# Patient Record
Sex: Female | Born: 1965 | Race: Black or African American | Hispanic: No | Smoking: Never smoker
Health system: Southern US, Community
[De-identification: ages and names within clinical notes are randomized; demographics above are authoritative.]

## PROBLEM LIST (undated history)

## (undated) ENCOUNTER — Ambulatory Visit (HOSPITAL_COMMUNITY): Admission: EM | Disposition: A | Discharge status: Home / Self Care | Payer: MEDICAID

## (undated) ENCOUNTER — Emergency Department (HOSPITAL_COMMUNITY): Payer: No Typology Code available for payment source

## (undated) DIAGNOSIS — N921 Excessive and frequent menstruation with irregular cycle: Secondary | ICD-10-CM

## (undated) DIAGNOSIS — D509 Iron deficiency anemia, unspecified: Secondary | ICD-10-CM

## (undated) DIAGNOSIS — R569 Unspecified convulsions: Secondary | ICD-10-CM

## (undated) DIAGNOSIS — D649 Anemia, unspecified: Secondary | ICD-10-CM

## (undated) DIAGNOSIS — R011 Cardiac murmur, unspecified: Secondary | ICD-10-CM

## (undated) DIAGNOSIS — D259 Leiomyoma of uterus, unspecified: Secondary | ICD-10-CM

## (undated) DIAGNOSIS — R0602 Shortness of breath: Secondary | ICD-10-CM

## (undated) DIAGNOSIS — R102 Pelvic and perineal pain: Secondary | ICD-10-CM

## (undated) HISTORY — DX: Cardiac murmur, unspecified: R01.1

## (undated) HISTORY — DX: Anemia, unspecified: D64.9

## (undated) HISTORY — PX: BREAST SURGERY: SHX581

## (undated) HISTORY — PX: MYOMECTOMY ABDOMINAL APPROACH: SUR870

## (undated) HISTORY — DX: Unspecified convulsions: R56.9

## (undated) NOTE — *Deleted (*Deleted)
HEMATOLOGY/ONCOLOGY CONSULTATION NOTE  Date of Service: 05/20/2020  Patient Care Team: Dois Davenport, MD as PCP - General (Family Medicine)  CHIEF COMPLAINTS/PURPOSE OF CONSULTATION:  Severe anemia  HISTORY OF PRESENTING ILLNESS:  Joanna Gross is a wonderful 32 y.o. female who has been referred to [place] by Dr. Hal Hope for evaluation and management of severe anemia. Pt is accompanied today by her mother. The pt reports that she is doing well overall.   The pt reports that her menstrual cycles last 10-12 days, with 2-3 heavy days. Pt has a history of Fibroids and two myomectomies. She currently has a fibroid. She has never used oral birth control. Pt has used Progesterone once to halt her period. Pt is currently following with an OBGYN.   Pt has a long history of anemia and required two units of blood in 2019. She has never been given IV Iron. She was given PO Iron, but was unable to continue taking it as it was causing constipation. Pt has been taking prenatal iron since 2019 and was switched to Fusion Plus iron on Monday. Pt has not had a Colonoscopy since turning 52 and reports regular bowel movements. She has significant ice cravings.  She currently has a sinus infection and began taking Doxycycline on Monday. Pt has lost 15 lbs in the last month with effort. She has recently developed new allergies. She has a history of Vitamin D deficiency. 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 Pt has no family history of blood disorders. Pt has had ankle swelling and restless legs at night recently.   Most recent lab results (03/03/2020) of CBC is as follows: all values are WNL except for Hgb at 6.6, MCV at 69. 03/03/2020 Ferritin at 4, Iron Sat 3.  05/18/2019 Vitamin D 25 (OH) at 16.7 05/18/2019 Ferritin at 13.2  On review of systems, pt reports palpitations, headaches, SOB, dizziness, lightheadedness, left forearm numbness, muscle cramping, numbness/tingling in legs and  denies bloody/black stools, bowel habit changes, fevers, abdominal pain, unexpected weight loss and any other symptoms.   On PMHx the pt reports Anemia, Heart murmur, Vitamin D deficiency, Fibroids, Myomectomy x2.  INTERVAL HISTORY: Joanna Gross is a wonderful 42 y.o. female who is here for evaluation and management of severe anemia. The patient's last visit with [place] was on 03/25/2020. The pt reports that she is doing well overall.  The pt reports ***  Of note since the patient's last visit, pt has had *** completed on *** with results revealing ***.  Lab results today (05/20/20) of CBC w/diff and CMP is as follows: all values are WNL except for ***. 05/20/2020 Ferritin at *** 05/20/2020 Vitamin B1 at *** 05/20/2020 Folate RBC at *** 05/20/2020 Iron Panel is as follows: ***  On review of systems, pt reports *** and denies ***and any other symptoms.   A&P: -Discussed pt labwork today, 05/20/20; *** -***  MEDICAL HISTORY:  Past Medical History:  Diagnosis Date  . Anemia   . Heart murmur   . Seizures (HCC) 1975   1 episode.  No meds since 1988    SURGICAL HISTORY: Past Surgical History:  Procedure Laterality Date  . BREAST SURGERY  nov. 2010   fibroid removed from right breast (3x)  . BREAST SURGERY  1989   fibroid removed from left breast  . BUNIONECTOMY  1999  . CYSTECTOMY  1974   left side of neck   . UTERINE FIBROID SURGERY  october 2003    SOCIAL HISTORY: Social History   Socioeconomic History  . Marital status: Divorced    Spouse name: n/a  . Number of children: 0  . Years of education: Not on file  . Highest education level: Not on file  Occupational History  . Occupation: [employer]    Comment: self-employed  Tobacco Use  . Smoking status: Never Smoker  . Smokeless tobacco: Never Used  Vaping Use  . Vaping Use: Never used  Substance and Sexual Activity  . Alcohol use: Yes    Alcohol/week: 0.0 - 1.0 standard drinks    Comment: RARE  . Drug use:  No  . Sexual activity: Not Currently    Partners: Male    Birth control/protection: Condom    Comment: 1st intercourse- 51, partners- 7, divorced   Other Topics Concern  . Not on file  Social History Narrative  . Not on file   Social Determinants of Health   Financial Resource Strain:   . Difficulty of Paying Living Expenses: Not on file  Food Insecurity:   . Worried About Programme researcher, broadcasting/film/video in the Last Year: Not on file  . Ran Out of Food in the Last Year: Not on file  Transportation Needs:   . Lack of Transportation (Medical): Not on file  . Lack of Transportation (Non-Medical): Not on file  Physical Activity:   . Days of Exercise per Week: Not on file  . Minutes of Exercise per Session: Not on file  Stress:   . Feeling of Stress : Not on file  Social Connections:   . Frequency of Communication with Friends and Family: Not on file  . Frequency of Social Gatherings with Friends and Family: Not on file  . Attends Religious Services: Not on file  . Active Member of Clubs or Organizations: Not on file  . Attends Banker Meetings: Not on file  . Marital Status: Not on file  Intimate Partner Violence:   . Fear of Current or Ex-Partner: Not on file  . Emotionally Abused: Not on file  . Physically Abused: Not on file  . Sexually Abused: Not on file    FAMILY HISTORY: Family History  Problem Relation Age of Onset  . Arthritis Mother   . Hypertension Father   . Diabetes Father     ALLERGIES:  is allergic to amoxicillin, furosemide, penicillins, bee venom, erythromycin, and eucalyptus flavor [flavoring agent].  MEDICATIONS:  Current Outpatient Medications  Medication Sig Dispense Refill  . acetaminophen-codeine (TYLENOL #3) 300-30 MG tablet Take 1 tablet by mouth every 6 (six) hours as needed for moderate pain.    Marland Kitchen doxycycline (VIBRAMYCIN) 100 MG capsule Take 100 mg by mouth daily.    . Fe Fum-Fe Poly-Vit C-Lactobac (FUSION PO) Take by mouth.    .  Fluticasone Propionate (FLONASE ALLERGY RELIEF NA)     . HYDROcodone-acetaminophen (NORCO/VICODIN) 5-325 MG tablet Take 1-2 tablets by mouth every 6 (six) hours as needed for severe pain. 12 tablet 0  . ibuprofen (ADVIL) 800 MG tablet Take 800 mg by mouth every 8 (eight) hours as needed.    . Iron-FA-B Cmp-C-Biot-Probiotic (FUSION PLUS PO)     . norethindrone (MICRONOR) 0.35 MG tablet Take 1 tablet (0.35 mg total) by mouth daily. 84 tablet 4   No current facility-administered medications for this visit.    REVIEW OF SYSTEMS:   A 10+ POINT REVIEW OF SYSTEMS WAS OBTAINED including neurology, dermatology, psychiatry, cardiac, respiratory, lymph, extremities,  GI, GU, Musculoskeletal, constitutional, breasts, reproductive, HEENT.  All pertinent positives are noted in the HPI.  All others are negative.   PHYSICAL EXAMINATION: ECOG PERFORMANCE STATUS: 1 - Symptomatic but completely ambulatory  . There were no vitals filed for this visit. There were no vitals filed for this visit. .There is no height or weight on file to calculate BMI.  *** GENERAL:alert, in no acute distress and comfortable SKIN: no acute rashes, no significant lesions EYES: conjunctiva are pink and non-injected, sclera anicteric OROPHARYNX: MMM, no exudates, no oropharyngeal erythema or ulceration NECK: supple, no JVD LYMPH:  no palpable lymphadenopathy in the cervical, axillary or inguinal regions LUNGS: clear to auscultation b/l with normal respiratory effort HEART: regular rate & rhythm ABDOMEN:  normoactive bowel sounds , non tender, not distended. No palpable hepatosplenomegaly.  Extremity: no pedal edema PSYCH: alert & oriented x 3 with fluent speech NEURO: no focal motor/sensory deficits  LABORATORY DATA:  I have reviewed the data as listed  . CBC Latest Ref Rng & Units 03/25/2020 03/25/2020 03/27/2018  WBC 4.0 - 10.5 K/uL 5.9 - 5.5  Hemoglobin 12.0 - 15.0 g/dL 7.1(L) - 8.9(L)  Hematocrit 34.0 - 46.6 % 26.3(L)  25.8(L) 31.0(L)  Platelets 150 - 400 K/uL 290 - 265   . CBC    Component Value Date/Time   WBC 5.9 03/25/2020 1409   RBC 3.90 03/25/2020 1409   HGB 7.1 (L) 03/25/2020 1409   HCT 26.3 (L) 03/25/2020 1409   HCT 25.8 (L) 03/25/2020 1409   PLT 290 03/25/2020 1409   MCV 67.4 (L) 03/25/2020 1409   MCV 79.1 (A) 08/06/2013 1534   MCH 18.2 (L) 03/25/2020 1409   MCHC 27.0 (L) 03/25/2020 1409   RDW 20.1 (H) 03/25/2020 1409   LYMPHSABS 1.0 03/25/2020 1409   MONOABS 0.5 03/25/2020 1409   EOSABS 0.0 03/25/2020 1409   BASOSABS 0.0 03/25/2020 1409    . CMP Latest Ref Rng & Units 03/25/2020 03/26/2018 03/26/2018  Glucose 70 - 99 mg/dL 161(W) 97 98  BUN 6 - 20 mg/dL 6 9 8   Creatinine 0.44 - 1.00 mg/dL 9.60 4.54 0.98  Sodium 135 - 145 mmol/L 137 137 135  Potassium 3.5 - 5.1 mmol/L 3.9 4.2 3.9  Chloride 98 - 111 mmol/L 106 103 101  CO2 22 - 32 mmol/L 26 26 24   Calcium 8.9 - 10.3 mg/dL 9.8 9.6 9.5  Total Protein 6.5 - 8.1 g/dL 7.9 8.1 8.0  Total Bilirubin 0.3 - 1.2 mg/dL 0.4 0.8 0.3  Alkaline Phos 38 - 126 U/L 78 56 62  AST 15 - 41 U/L 18 37 23  ALT 0 - 44 U/L 17 18 16    . Lab Results  Component Value Date   IRON 18 (L) 03/25/2020   TIBC 460 (H) 03/25/2020   IRONPCTSAT 4 (L) 03/25/2020   (Iron and TIBC)  Lab Results  Component Value Date   FERRITIN 6 (L) 03/25/2020     RADIOGRAPHIC STUDIES: I have personally reviewed the radiological images as listed and agreed with the findings in the report. No results found.  ASSESSMENT & PLAN:   53 yo with   1) Severe iron deficiency with microcytic severe anemia PLAN: ***  -Advised pt that her iron deficiency and anemia is likely from heavy menstrual losses.  -Not unreasonable for pt to continue taking Fusion Plus (130 mg Iron) after IV Iron to maintain Ferritin levels.  -Recommend pt begin an OTC B-complex daily.  -Would hold blood transfusion if Hgb >  7.    FOLLOW UP: ***   The total time spent in the appt was *** minutes and  more than 50% was on counseling and direct patient cares.  All of the patient's questions were answered with apparent satisfaction. The patient knows to call the clinic with any problems, questions or concerns.    Wyvonnia Lora MD MS AAHIVMS [facility] Hematology/Oncology Physician [facility]  (Office):       [phone] (Work cell):  [phone] (Fax):           [phone]  05/20/2020 7:27 AM  I, Carollee Herter, am acting as a scribe for Dr. Wyvonnia Lora.   {Add Production assistant, radio Statement}

---
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
## 1987-07-05 HISTORY — PX: BREAST SURGERY: SHX581

## 1997-07-04 HISTORY — PX: BUNIONECTOMY: SHX129

## 2002-04-03 HISTORY — PX: UTERINE FIBROID SURGERY: SHX826

## 2004-10-27 ENCOUNTER — Emergency Department (HOSPITAL_COMMUNITY): Admission: EM | Admit: 2004-10-27 | Discharge: 2004-10-27 | Payer: Self-pay | Admitting: Emergency Medicine

## 2007-03-18 ENCOUNTER — Emergency Department (HOSPITAL_COMMUNITY): Admission: EM | Admit: 2007-03-18 | Discharge: 2007-03-18 | Payer: Self-pay | Admitting: Emergency Medicine

## 2007-05-12 ENCOUNTER — Emergency Department (HOSPITAL_COMMUNITY): Admission: EM | Admit: 2007-05-12 | Discharge: 2007-05-13 | Payer: Self-pay | Admitting: Emergency Medicine

## 2007-05-16 ENCOUNTER — Ambulatory Visit: Payer: Self-pay | Admitting: Family Medicine

## 2007-05-16 DIAGNOSIS — R079 Chest pain, unspecified: Secondary | ICD-10-CM | POA: Insufficient documentation

## 2008-04-07 ENCOUNTER — Encounter: Admission: RE | Admit: 2008-04-07 | Discharge: 2008-04-07 | Payer: Self-pay | Admitting: Family Medicine

## 2009-05-04 HISTORY — PX: BREAST SURGERY: SHX581

## 2010-07-25 ENCOUNTER — Encounter: Payer: Self-pay | Admitting: Surgery

## 2011-04-12 LAB — I-STAT 8, (EC8 V) (CONVERTED LAB)
BUN: 8
Bicarbonate: 24
Chloride: 105
Glucose, Bld: 96
Potassium: 3.8
pH, Ven: 7.358 — ABNORMAL HIGH

## 2011-04-12 LAB — PROTIME-INR: INR: 0.9

## 2011-04-12 LAB — POCT CARDIAC MARKERS
Myoglobin, poc: 54.6
Operator id: 133351

## 2011-05-24 ENCOUNTER — Ambulatory Visit (INDEPENDENT_AMBULATORY_CARE_PROVIDER_SITE_OTHER): Payer: Self-pay | Admitting: General Surgery

## 2011-05-24 ENCOUNTER — Encounter (INDEPENDENT_AMBULATORY_CARE_PROVIDER_SITE_OTHER): Payer: Self-pay | Admitting: General Surgery

## 2011-05-24 VITALS — BP 128/82 | HR 64 | Temp 97.2°F | Resp 16 | Ht 65.5 in | Wt 220.2 lb

## 2011-05-24 DIAGNOSIS — N6320 Unspecified lump in the left breast, unspecified quadrant: Secondary | ICD-10-CM

## 2011-05-24 DIAGNOSIS — N63 Unspecified lump in unspecified breast: Secondary | ICD-10-CM

## 2011-05-24 NOTE — Patient Instructions (Signed)
Plan for mammogram and u/s

## 2011-05-30 ENCOUNTER — Encounter (INDEPENDENT_AMBULATORY_CARE_PROVIDER_SITE_OTHER): Payer: Self-pay | Admitting: General Surgery

## 2011-05-30 NOTE — Progress Notes (Signed)
Subjective:     Patient ID: Joanna Gross, female   DOB: 1965/09/01, 45 y.o.   MRN: [id number]  HPI We are asked to see the patient in consultation by Dr. Myrtis Ser to evaluate her for a left breast mass. The patient is a 45 year old female who has been noticing some soreness on the lateral aspect of the left breast. The pain tends to come and go but it does not seem to rotate around her menstrual cycle. She has felt a lump recently. She denies any discharge from her breast. She does have a history of for fibroadenomas removed 3 from the right and one from the left.  Review of Systems  Constitutional: Negative.   HENT: Negative.   Eyes: Negative.   Respiratory: Negative.   Cardiovascular: Negative.   Gastrointestinal: Negative.   Genitourinary: Negative.   Musculoskeletal: Negative.   Skin: Negative.   Neurological: Negative.   Hematological: Negative.   Psychiatric/Behavioral: Negative.        Objective:   Physical Exam  Constitutional: She is oriented to person, place, and time. She appears well-developed and well-nourished.  HENT:  Head: Normocephalic and atraumatic.  Eyes: Conjunctivae and EOM are normal. Pupils are equal, round, and reactive to light.  Neck: Normal range of motion. Neck supple.  Cardiovascular: Normal rate, regular rhythm and normal heart sounds.   Pulmonary/Chest: Effort normal and breath sounds normal.       The patient does have a palpable mobile well-circumscribed mass in the lateral aspect of the left breast. No other palpable masses. No axillary supraclavicular or cervical lymphadenopathy.  Abdominal: Soft. Bowel sounds are normal. She exhibits no mass. There is no tenderness.  Musculoskeletal: Normal range of motion.  Lymphadenopathy:    She has no cervical adenopathy.  Neurological: She is alert and oriented to person, place, and time.  Skin: Skin is warm and dry.  Psychiatric: She has a normal mood and affect. Her behavior is normal.         Assessment:     The patient does have a palpable mass in the lateral aspect of left breast. She has a history of fibroadenomas. It does fill well circumscribed similar to a fibroadenoma.    Plan:     At this point she will need to be evaluated with mammogram and ultrasound and possible biopsy of a mass was found. We will order the studies and plan to see her back in about 2 weeks ago the results.

## 2011-06-02 ENCOUNTER — Ambulatory Visit
Admission: RE | Admit: 2011-06-02 | Discharge: 2011-06-02 | Disposition: A | Discharge status: Home / Self Care | Payer: No Typology Code available for payment source | Source: Ambulatory Visit | Attending: General Surgery | Admitting: General Surgery

## 2011-06-02 DIAGNOSIS — N6320 Unspecified lump in the left breast, unspecified quadrant: Secondary | ICD-10-CM

## 2011-06-09 ENCOUNTER — Encounter (INDEPENDENT_AMBULATORY_CARE_PROVIDER_SITE_OTHER): Payer: Self-pay | Admitting: General Surgery

## 2011-06-13 ENCOUNTER — Encounter (INDEPENDENT_AMBULATORY_CARE_PROVIDER_SITE_OTHER): Payer: Self-pay | Admitting: General Surgery

## 2011-06-23 ENCOUNTER — Encounter (INDEPENDENT_AMBULATORY_CARE_PROVIDER_SITE_OTHER): Payer: Self-pay | Admitting: General Surgery

## 2011-08-07 ENCOUNTER — Ambulatory Visit: Payer: Self-pay | Admitting: Family Medicine

## 2011-08-07 VITALS — BP 114/71 | HR 60 | Temp 97.9°F | Resp 16 | Ht 66.0 in | Wt 220.0 lb

## 2011-08-07 DIAGNOSIS — R51 Headache: Secondary | ICD-10-CM

## 2011-08-07 DIAGNOSIS — H538 Other visual disturbances: Secondary | ICD-10-CM

## 2011-08-07 DIAGNOSIS — M25512 Pain in left shoulder: Secondary | ICD-10-CM

## 2011-08-07 DIAGNOSIS — R519 Headache, unspecified: Secondary | ICD-10-CM

## 2011-08-07 DIAGNOSIS — M25519 Pain in unspecified shoulder: Secondary | ICD-10-CM

## 2011-08-07 DIAGNOSIS — N912 Amenorrhea, unspecified: Secondary | ICD-10-CM

## 2011-08-07 LAB — POCT URINE PREGNANCY: Preg Test, Ur: NEGATIVE

## 2011-08-07 LAB — GLUCOSE, POCT (MANUAL RESULT ENTRY): POC Glucose: 102

## 2011-08-07 MED ORDER — HYDROCODONE-ACETAMINOPHEN 5-500 MG PO TABS: 1.0000 | ORAL_TABLET | Freq: Three times a day (TID) | ORAL | Status: AC | PRN

## 2011-08-07 MED ORDER — PREDNISONE 20 MG PO TABS: 20.0000 mg | ORAL_TABLET | Freq: Every day | ORAL | Status: AC

## 2011-08-07 NOTE — Progress Notes (Signed)
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  Has tried herbal meds, chiropractor without sustained relief.  Right knee is better. Now cannot lift left arm without pain.  Also c/o worsening headaches lately with posterior trapezius pain on the right.  Since 1st of year, headaches are daily.  Did preg test at home which was normal(not pregnant).  Last normal period was Dec 14th.  Occas blurry visioin.  No nausea.  Doesn't awaken.  No fever or neck stiffness.  O:  NAD Skin warm and dry Neuro:  Alert, appropriate.  Moves 4 extrem equally.  CN intact HEENT normal  Left shoulder:  Tender at biceps insertion with slow painful but FROM   Assess:  Amenorrhea, chronic right knee pain, neck and shoulder pain on L.  P:  Prednisone for the shoulder pain and knee pain.  Pt to call re: amenorrhea if No period in 2 weeks.  Vicodin #10 1 q6h prn ha

## 2011-08-07 NOTE — Patient Instructions (Signed)
Primary Amenorrhea  Primary amenorrhea is the absence of any menstrual flow in a woman by the age of sixteen. An average age for the start of menstruation is age twelve. Primary amenorrhea is not considered to have occurred until a girl is over age 44 and has never menstruated. This may occur with or without other signs of puberty. CAUSES  Some common causes of not menstruating include:  Malnutrition.   Low blood sugar (hypoglycemia).   Polycystic ovarian disease (cysts in the ovaries, not ovulating).   Absence of the vagina, uterus, or ovaries since birth (congenital).   Extreme obesity.   Cystic fibrosis.   Drastic weight loss from any cause.   Over-exercising (running, biking) causing loss of body fat.   Pituitary gland tumor in the brain.   Long-term (chronic) illnesses.   Cushing's disease.   Thyroid disease (hypothyroidism, hyperthyroidism).   Part of the brain (hypothalamus) not functioning.   Premature ovarian failure.  DIAGNOSIS  This diagnosis is made by doing a medical history and physical exam. Often, numerous blood tests of different hormones in the body may be done, along with some urine testing. Specialized x-rays may need to be done, as well as measuring the Body Mass Index (BMI). If your BMI is less than 20, the hypothalamus may be the problem. If it is greater than 30, the cause may be diabetes mellitus. Pregnancy must be ruled out. TREATMENT  Treatment depends on the cause of the amenorrhea. If an eating disorder is present, this can be treated with an adequate diet and therapy. Chronic illnesses may improve with treatment of the illness. Overall, the outlook is good, and amenorrhea may be corrected with medicines, lifestyle changes, or surgery. If the amenorrhea can not be corrected, it is sometimes possible to create a false menstruation with medicines, to help young women feel more normal. Should emotional problems occur, your caregiver will be able to help  you.  Document Released: 06/20/2005 Document Revised: 03/02/2011 Document Reviewed: 02/06/2008 [employer] Patient Information 2012 [employer]. 

## 2011-08-08 ENCOUNTER — Ambulatory Visit: Payer: Self-pay | Admitting: Family Medicine

## 2011-08-08 VITALS — BP 117/75 | HR 66 | Temp 98.0°F | Resp 16 | Ht 66.0 in | Wt 224.0 lb

## 2011-08-08 DIAGNOSIS — K5289 Other specified noninfective gastroenteritis and colitis: Secondary | ICD-10-CM

## 2011-08-08 DIAGNOSIS — R197 Diarrhea, unspecified: Secondary | ICD-10-CM

## 2011-08-08 MED ORDER — METRONIDAZOLE 500 MG PO TABS: 500.0000 mg | ORAL_TABLET | Freq: Three times a day (TID) | ORAL | Status: AC

## 2011-08-08 NOTE — Progress Notes (Signed)
46 year old [place] American woman who comes in with acute and chronic diarrhea. She initially noticed loose stools after her trip to [place] about a month ago. Since that time she's been loaded with crampy abdominal pain and intermittent belching. She was just seen yesterday for shoulder and knee pain, and started her prednisone and Vicodin last night. She's started her period overnight as well.  Objective: Patient appears bloated and in mild distress. Her skin is warm and dry. HEENT is unremarkable. Breathing is normal.  Abdomen abdominal exam: Hyperactive bowel sounds mild abdominal distention with diffuse tenderness most prominently in the left lower quadrant.  Assessment: This patient presents with a picture of giardiasis. It's possible that she just has a viral gastroenteritis but given the length of time and belching I feel that giardiasis needs to be treated.  Plan: This patient calling back tomorrow to get a followup progress report. [employer] asked her to call me tomorrow to follow progress report.

## 2011-08-08 NOTE — Patient Instructions (Signed)
Giardiasis Giardiasis is an infection of the small intestine with the parasite Giardia intestinalis. Giardia intestinalis cannot be seen with the naked eye. It is often found in unclean (contaminated) water.  CAUSES  Infection can be caused by drinking contaminated water. Giardia intestinalis can also be found in some tap water. SYMPTOMS  An infection causes:  Explosive, foul smelling, watery diarrhea.   A Feeling of sickness in your stomach (nausea).   Abdominal cramps and pain.  It takes about 1 to 2 weeks after ingesting infected water or food to get sick. The illness usually lasts 2 to 4 weeks. Infection in infants and children can be long lasting. DIAGNOSIS  It can be diagnosed by stool exam. Blood tests may be needed. TREATMENT  Medications can be given to shorten the course of the illness. HOME CARE INSTRUCTIONS   In areas of contamination, boil your water if possible. Filtering tap water in areas of contamination removes most Giardia. Cysts of Giardia Intestinalis are resistant to chlorine.   Be careful handling soiled undergarments and diapers. If infection is present, it is easily passed by hand to mouth. Use good hand-washing techniques.   Follow up with your caregiver as directed.  SEEK MEDICAL CARE IF:  You do not get better. Document Released: 06/17/2000 Document Revised: 03/02/2011 Document Reviewed: 02/07/2008 [facility] Patient Information 2012 [place], [place].

## 2012-07-11 ENCOUNTER — Ambulatory Visit (INDEPENDENT_AMBULATORY_CARE_PROVIDER_SITE_OTHER): Payer: BC Managed Care – PPO | Admitting: Family Medicine

## 2012-07-11 ENCOUNTER — Ambulatory Visit: Payer: BC Managed Care – PPO

## 2012-07-11 VITALS — BP 102/70 | HR 80 | Temp 98.0°F | Resp 16 | Ht 66.0 in | Wt 210.0 lb

## 2012-07-11 DIAGNOSIS — R05 Cough: Secondary | ICD-10-CM

## 2012-07-11 DIAGNOSIS — J3489 Other specified disorders of nose and nasal sinuses: Secondary | ICD-10-CM

## 2012-07-11 DIAGNOSIS — R059 Cough, unspecified: Secondary | ICD-10-CM

## 2012-07-11 DIAGNOSIS — R0981 Nasal congestion: Secondary | ICD-10-CM

## 2012-07-11 LAB — POCT CBC
Granulocyte percent: 56.7 %G (ref 37–80)
HCT, POC: 37.4 % — AB (ref 37.7–47.9)
Hemoglobin: 11.8 g/dL — AB (ref 12.2–16.2)
Lymph, poc: 1.6 (ref 0.6–3.4)
MCH, POC: 27.1 pg (ref 27–31.2)
MCHC: 31.6 g/dL — AB (ref 31.8–35.4)
MCV: 85.8 fL (ref 80–97)
MID (cbc): 0.4 (ref 0–0.9)
MPV: 9.2 fL (ref 0–99.8)
POC Granulocyte: 2.6 (ref 2–6.9)
POC LYMPH PERCENT: 35.1 %L (ref 10–50)
POC MID %: 8.2 %M (ref 0–12)
Platelet Count, POC: 222 10*3/uL (ref 142–424)
RBC: 4.36 M/uL (ref 4.04–5.48)
RDW, POC: 18.7 %
WBC: 4.6 10*3/uL (ref 4.6–10.2)

## 2012-07-11 MED ORDER — DOXYCYCLINE HYCLATE 100 MG PO CAPS: 100.0000 mg | ORAL_CAPSULE | Freq: Two times a day (BID) | ORAL | Status: DC

## 2012-07-11 MED ORDER — HYDROCOD POLST-CHLORPHEN POLST 10-8 MG/5ML PO LQCR: 5.0000 mL | Freq: Two times a day (BID) | ORAL | Status: DC | PRN

## 2012-07-11 MED ORDER — IPRATROPIUM BROMIDE 0.03 % NA SOLN: 2.0000 | Freq: Two times a day (BID) | NASAL | Status: DC

## 2012-07-11 NOTE — Progress Notes (Signed)
  Subjective:    Patient ID: Joanna Gross, female    DOB: 06-21-1966, 47 y.o.   MRN: [id number]  HPI 47 year old female presents with 1 week history of nasal congestion, cough productive of yellow sputum, and sore throat. States she did have one episode of pink sputum production.  No fevers, chills, nausea, vomiting, headache, neck stiffness, or dizziness. Denies fever at onset of illness. She has been taking Alka-Seltzer and using lozenges which has not been helping much.  She is otherwise healthy with no other complaints today.     Review of Systems  Constitutional: Negative for fever and chills.  HENT: Positive for congestion, sore throat, rhinorrhea and postnasal drip. Negative for neck pain, neck stiffness and sinus pressure.   Respiratory: Positive for cough. Negative for shortness of breath and wheezing.   Cardiovascular: Negative for chest pain.  Gastrointestinal: Negative for nausea, vomiting and abdominal pain.  Skin: Negative for rash.  All other systems reviewed and are negative.       Objective:   Physical Exam  Constitutional: She is oriented to person, place, and time. She appears well-developed and well-nourished.  HENT:  Head: Normocephalic and atraumatic.  Right Ear: Hearing, tympanic membrane, external ear and ear canal normal.  Left Ear: Hearing, tympanic membrane, external ear and ear canal normal.  Mouth/Throat: Uvula is midline and oropharynx is clear and moist. No oropharyngeal exudate (clear postnasal drainage) or tonsillar abscesses.  Eyes: Conjunctivae normal are normal.  Neck: Normal range of motion. Neck supple.  Cardiovascular: Normal rate, regular rhythm and normal heart sounds.   Pulmonary/Chest: Effort normal and breath sounds normal.  Lymphadenopathy:    She has no cervical adenopathy.  Neurological: She is alert and oriented to person, place, and time.  Psychiatric: She has a normal mood and affect. Her behavior is normal. Judgment and thought  content normal.      [facility] reading (PRIMARY) by  Dr. Patsy Lager as normal chest x-ray.      Assessment & Plan:   1. Cough  DG Chest 2 View, POCT CBC, doxycycline (VIBRAMYCIN) 100 MG capsule, chlorpheniramine-HYDROcodone (TUSSIONEX PENNKINETIC ER) 10-8 MG/5ML LQCR  2. Nasal congestion  ipratropium (ATROVENT) 0.03 % nasal spray   Will cover bronchitis with Doxycycline bid x 10 days Atrovent NS bid Zyrtec daily to help with laryngitis - voice rest and increase fluids! Tussionex q12hours as needed for cough Follow up if symptoms worsen or fail to improve.

## 2012-11-05 ENCOUNTER — Ambulatory Visit (INDEPENDENT_AMBULATORY_CARE_PROVIDER_SITE_OTHER): Payer: BC Managed Care – PPO | Admitting: Physician Assistant

## 2012-11-05 VITALS — BP 119/75 | HR 84 | Temp 98.5°F | Resp 16 | Ht 65.5 in | Wt 208.0 lb

## 2012-11-05 DIAGNOSIS — H00019 Hordeolum externum unspecified eye, unspecified eyelid: Secondary | ICD-10-CM

## 2012-11-05 DIAGNOSIS — H00013 Hordeolum externum right eye, unspecified eyelid: Secondary | ICD-10-CM

## 2012-11-05 NOTE — Patient Instructions (Signed)
Use a warm compress to the site for 15-20 minutes 2-3 times daily.  Use an OTC antihistamine for itching (Claritin, Zyrtec, Allegra) and ibuprofen to reduce the swelling.  Discontinue topical products on the eyelid.

## 2012-11-05 NOTE — Progress Notes (Signed)
  Subjective:    Patient ID: Joanna Gross, female    DOB: 17-May-1966, 47 y.o.   MRN: [id number]  HPI This 47 y.o. female presents for evaluation of swelling and itching of the RIGHT eyelid x 2 days.  Feels like she got a mosquito bite there, ans she's had similar reactions to bites elsewhere. No change in vision. The eyelid is tender when she rubs it.  No drainage. Topical products and Visine are ineffective.  Past medical history, surgical history, family history, social history and problem list reviewed.   Review of Systems As above.    Objective:   Physical Exam  Vitals reviewed. Constitutional: She is oriented to person, place, and time. Vital signs are normal. She appears well-developed and well-nourished. No distress.  HENT:  Head: Normocephalic and atraumatic.  Eyes: Conjunctivae and EOM are normal. Pupils are equal, round, and reactive to light. Right eye exhibits no discharge. Left eye exhibits no discharge. No scleral icterus.  Swelling of the RIGHT upper eyelid.  No lesions. Lid everted-no lesions on the under-surface.  Neck: Normal range of motion. Neck supple. No thyromegaly present.  Cardiovascular: Normal rate.   Pulmonary/Chest: Effort normal.  Lymphadenopathy:    She has no cervical adenopathy.  Neurological: She is alert and oriented to person, place, and time. No cranial nerve deficit.  Skin: Skin is warm and dry. No abrasion, no bruising, no burn, no ecchymosis, no laceration, no lesion, no petechiae and no rash noted. No erythema.  Psychiatric: She has a normal mood and affect. Her behavior is normal.      Assessment & Plan:  Hordeolum externum, right Patient Instructions  Use a warm compress to the site for 15-20 minutes 2-3 times daily.  Use an OTC antihistamine for itching (Claritin, Zyrtec, Allegra) and ibuprofen to reduce the swelling.  Discontinue topical products on the eyelid.   RTC if symptoms worsen/persist.  Fernande Bras, PA-C Physician  Assistant-Certified [facility]

## 2013-08-06 ENCOUNTER — Ambulatory Visit (HOSPITAL_COMMUNITY)
Admission: RE | Admit: 2013-08-06 | Discharge: 2013-08-06 | Disposition: A | Discharge status: Home / Self Care | Payer: 59 | Source: Ambulatory Visit | Attending: Internal Medicine | Admitting: Internal Medicine

## 2013-08-06 ENCOUNTER — Ambulatory Visit (INDEPENDENT_AMBULATORY_CARE_PROVIDER_SITE_OTHER): Payer: 59 | Admitting: Internal Medicine

## 2013-08-06 ENCOUNTER — Ambulatory Visit: Payer: 59

## 2013-08-06 VITALS — BP 100/70 | HR 67 | Temp 98.2°F | Resp 16 | Ht 65.5 in | Wt 217.0 lb

## 2013-08-06 DIAGNOSIS — R1011 Right upper quadrant pain: Secondary | ICD-10-CM

## 2013-08-06 DIAGNOSIS — D259 Leiomyoma of uterus, unspecified: Secondary | ICD-10-CM | POA: Insufficient documentation

## 2013-08-06 DIAGNOSIS — R1031 Right lower quadrant pain: Secondary | ICD-10-CM | POA: Insufficient documentation

## 2013-08-06 LAB — POCT CBC
Granulocyte percent: 56.2 %G (ref 37–80)
HEMATOCRIT: 34.6 % — AB (ref 37.7–47.9)
HEMOGLOBIN: 9.9 g/dL — AB (ref 12.2–16.2)
Lymph, poc: 1.5 (ref 0.6–3.4)
MCH: 22.6 pg — AB (ref 27–31.2)
MCHC: 28.6 g/dL — AB (ref 31.8–35.4)
MCV: 79.1 fL — AB (ref 80–97)
MID (cbc): 0.3 (ref 0–0.9)
MPV: 8.8 fL (ref 0–99.8)
PLATELET COUNT, POC: 214 10*3/uL (ref 142–424)
POC Granulocyte: 2.2 (ref 2–6.9)
POC LYMPH %: 36.9 % (ref 10–50)
POC MID %: 6.9 % (ref 0–12)
RBC: 4.38 M/uL (ref 4.04–5.48)
RDW, POC: 20.8 %
WBC: 4 10*3/uL — AB (ref 4.6–10.2)

## 2013-08-06 LAB — LIPASE: Lipase: 15 U/L (ref 0–75)

## 2013-08-06 LAB — COMPREHENSIVE METABOLIC PANEL
ALK PHOS: 72 U/L (ref 39–117)
ALT: 16 U/L (ref 0–35)
AST: 19 U/L (ref 0–37)
Albumin: 4.2 g/dL (ref 3.5–5.2)
BILIRUBIN TOTAL: 0.6 mg/dL (ref 0.2–1.2)
BUN: 4 mg/dL — ABNORMAL LOW (ref 6–23)
CHLORIDE: 101 meq/L (ref 96–112)
CO2: 27 meq/L (ref 19–32)
CREATININE: 0.63 mg/dL (ref 0.50–1.10)
Calcium: 9.5 mg/dL (ref 8.4–10.5)
Glucose, Bld: 83 mg/dL (ref 70–99)
Potassium: 4.1 mEq/L (ref 3.5–5.3)
SODIUM: 135 meq/L (ref 135–145)
Total Protein: 7.9 g/dL (ref 6.0–8.3)

## 2013-08-06 LAB — POCT UA - MICROSCOPIC ONLY
Bacteria, U Microscopic: NEGATIVE
CRYSTALS, UR, HPF, POC: NEGATIVE
Casts, Ur, LPF, POC: NEGATIVE
EPITHELIAL CELLS, URINE PER MICROSCOPY: NEGATIVE
Mucus, UA: NEGATIVE
RBC, urine, microscopic: NEGATIVE
WBC, Ur, HPF, POC: NEGATIVE
YEAST UA: NEGATIVE

## 2013-08-06 LAB — POCT URINALYSIS DIPSTICK
BILIRUBIN UA: NEGATIVE
Blood, UA: NEGATIVE
GLUCOSE UA: NEGATIVE
Ketones, UA: NEGATIVE
Leukocytes, UA: NEGATIVE
NITRITE UA: NEGATIVE
PROTEIN UA: NEGATIVE
SPEC GRAV UA: 1.01
UROBILINOGEN UA: 0.2
pH, UA: 7

## 2013-08-06 LAB — POCT SEDIMENTATION RATE: POCT SED RATE: 64 mm/h — AB (ref 0–22)

## 2013-08-06 MED ORDER — IOHEXOL 300 MG/ML  SOLN
100.0000 mL | Freq: Once | INTRAMUSCULAR | Status: AC | PRN
  Administered 2013-08-06: 100 mL via INTRAVENOUS

## 2013-08-06 MED ORDER — HYDROCODONE-ACETAMINOPHEN 7.5-325 MG/15ML PO SOLN: 5.0000 mL | Freq: Four times a day (QID) | ORAL | Status: DC | PRN

## 2013-08-06 NOTE — Progress Notes (Signed)
Subjective:    Patient ID: Joanna Gross, female    DOB: Oct 05, 1965, 48 y.o.   MRN: [id number]  HPI Patient presents with abdominal pain, cramping, bloating, and lots of gas since last Friday.  Patient having regular bowel movements.  Has had minor pain like this for months Pain worse after eating, maybe fever. Nausea but no vomiting. Diaphoretic one spell   Review of Systems     Objective:   Physical Exam  Constitutional: She is oriented to person, place, and time. She appears well-developed and well-nourished. No distress.  HENT:  Head: Normocephalic.  Eyes: EOM are normal. Pupils are equal, round, and reactive to light.  Neck: Normal range of motion.  Cardiovascular: Normal rate, regular rhythm and normal heart sounds.   Pulmonary/Chest: Effort normal and breath sounds normal.  Abdominal: She exhibits distension. She exhibits no shifting dullness and no fluid wave. There is tenderness in the right upper quadrant and right lower quadrant. There is rebound, guarding, CVA tenderness, tenderness at McBurney's point and positive Murphy's sign. There is no rigidity.  Jump test, mild increase in pain  Musculoskeletal: Normal range of motion.  Neurological: She is alert and oriented to person, place, and time. She exhibits normal muscle tone. Coordination normal.  Skin: No rash noted.  Psychiatric: Her behavior is normal. Thought content normal.   [facility] reading (PRIMARY) by  Dr Elder Cyphers  . Results for orders placed in visit on 08/06/13  POCT CBC      Result Value Range   WBC 4.0 (*) 4.6 - 10.2 K/uL   Lymph, poc 1.5  0.6 - 3.4   POC LYMPH PERCENT 36.9  10 - 50 %L   MID (cbc) 0.3  0 - 0.9   POC MID % 6.9  0 - 12 %M   POC Granulocyte 2.2  2 - 6.9   Granulocyte percent 56.2  37 - 80 %G   RBC 4.38  4.04 - 5.48 M/uL   Hemoglobin 9.9 (*) 12.2 - 16.2 g/dL   HCT, POC 34.6 (*) 37.7 - 47.9 %   MCV 79.1 (*) 80 - 97 fL   MCH, POC 22.6 (*) 27 - 31.2 pg   MCHC 28.6 (*) 31.8 - 35.4 g/dL   RDW, POC 20.8     Platelet Count, POC 214  142 - 424 K/uL   MPV 8.8  0 - 99.8 fL  POCT UA - MICROSCOPIC ONLY      Result Value Range   WBC, Ur, HPF, POC NEG     RBC, urine, microscopic NEG     Bacteria, U Microscopic NEG     Mucus, UA NEG     Epithelial cells, urine per micros NEG     Crystals, Ur, HPF, POC NEG     Casts, Ur, LPF, POC NEG     Yeast, UA NEG    POCT URINALYSIS DIPSTICK      Result Value Range   Color, UA YELLOW     Clarity, UA CLEAR     Glucose, UA NEG     Bilirubin, UA NEG     Ketones, UA NEG     Spec Grav, UA 1.010     Blood, UA NEG     pH, UA 7.0     Protein, UA NEG     Urobilinogen, UA 0.2     Nitrite, UA NEG     Leukocytes, UA Negative       Mild anemia  Assessment & Plan:  Right greater than left abdominal pain Probable constipation/Rule out Gall bladder disease,appendicitis,Pancreatitis,etc CT scan today Clear liquid diet only/Tylenol or lortab for pain RTC in am

## 2013-08-06 NOTE — Progress Notes (Signed)
   Subjective:    Patient ID: Joanna Gross, female    DOB: 03/16/1966, 48 y.o.   MRN: [id number]  HPI    Review of Systems     Objective:   Physical Exam        Assessment & Plan:

## 2013-08-06 NOTE — Patient Instructions (Addendum)
Abdominal Pain, Adult °Many things can cause abdominal pain. Usually, abdominal pain is not caused by a disease and will improve without treatment. It can often be observed and treated at home. Your health care provider will do a physical exam and possibly order blood tests and X-rays to help determine the seriousness of your pain. However, in many cases, more time must pass before a clear cause of the pain can be found. Before that point, your health care provider may not know if you need more testing or further treatment. °HOME CARE INSTRUCTIONS  °Monitor your abdominal pain for any changes. The following actions may help to alleviate any discomfort you are experiencing: °· Only take over-the-counter or prescription medicines as directed by your health care provider. °· Do not take laxatives unless directed to do so by your health care provider. °· Try a clear liquid diet (broth, tea, or water) as directed by your health care provider. Slowly move to a bland diet as tolerated. °SEEK MEDICAL CARE IF: °· You have unexplained abdominal pain. °· You have abdominal pain associated with nausea or diarrhea. °· You have pain when you urinate or have a bowel movement. °· You experience abdominal pain that wakes you in the night. °· You have abdominal pain that is worsened or improved by eating food. °· You have abdominal pain that is worsened with eating fatty foods. °SEEK IMMEDIATE MEDICAL CARE IF:  °· Your pain does not go away within 2 hours. °· You have a fever. °· You keep throwing up (vomiting). °· Your pain is felt only in portions of the abdomen, such as the right side or the left lower portion of the abdomen. °· You pass bloody or black tarry stools. °MAKE SURE YOU: °· Understand these instructions.   °· Will watch your condition.   °· Will get help right away if you are not doing well or get worse.   °Document Released: 03/30/2005 Document Revised: 04/10/2013 Document Reviewed: 02/27/2013 °Exit[employer]® Patient  Information ©2014 [employer]. °Constipation, Adult °Constipation is when a person has fewer than 3 bowel movements a week; has difficulty having a bowel movement; or has stools that are dry, hard, or larger than normal. As people grow older, constipation is more common. If you try to fix constipation with medicines that make you have a bowel movement (laxatives), the problem may get worse. Long-term laxative use may cause the muscles of the colon to become weak. A low-fiber diet, not taking in enough fluids, and taking certain medicines may make constipation worse. °CAUSES  °· Certain medicines, such as antidepressants, pain medicine, iron supplements, antacids, and water pills.   °· Certain diseases, such as diabetes, irritable bowel syndrome (IBS), thyroid disease, or depression.   °· Not drinking enough water.   °· Not eating enough fiber-rich foods.   °· Stress or travel. °· Lack of physical activity or exercise. °· Not going to the restroom when there is the urge to have a bowel movement. °· Ignoring the urge to have a bowel movement. °· Using laxatives too much. °SYMPTOMS  °· Having fewer than 3 bowel movements a week.   °· Straining to have a bowel movement.   °· Having hard, dry, or larger than normal stools.   °· Feeling full or bloated.   °· Pain in the lower abdomen. °· Not feeling relief after having a bowel movement. °DIAGNOSIS  °Your caregiver will take a medical history and perform a physical exam. Further testing may be done for severe constipation. Some tests may include:  °· A barium enema   X-ray to examine your rectum, colon, and sometimes, your small intestine. °· A sigmoidoscopy to examine your lower colon. °· A colonoscopy to examine your entire colon. °TREATMENT  °Treatment will depend on the severity of your constipation and what is causing it. Some dietary treatments include drinking more fluids and eating more fiber-rich foods. Lifestyle treatments may include regular exercise. If these  diet and lifestyle recommendations do not help, your caregiver may recommend taking over-the-counter laxative medicines to help you have bowel movements. Prescription medicines may be prescribed if over-the-counter medicines do not work.  °HOME CARE INSTRUCTIONS  °· Increase dietary fiber in your diet, such as fruits, vegetables, whole grains, and beans. Limit high-fat and processed sugars in your diet, such as French fries, hamburgers, cookies, candies, and soda.   °· A fiber supplement may be added to your diet if you cannot get enough fiber from foods.   °· Drink enough fluids to keep your urine clear or pale yellow.   °· Exercise regularly or as directed by your caregiver.   °· Go to the restroom when you have the urge to go. Do not hold it. °· Only take medicines as directed by your caregiver. Do not take other medicines for constipation without talking to your caregiver first. °SEEK IMMEDIATE MEDICAL CARE IF:  °· You have bright red blood in your stool.   °· Your constipation lasts for more than 4 days or gets worse.   °· You have abdominal or rectal pain.   °· You have thin, pencil-like stools. °· You have unexplained weight loss. °MAKE SURE YOU:  °· Understand these instructions. °· Will watch your condition. °· Will get help right away if you are not doing well or get worse. °Document Released: 03/18/2004 Document Revised: 09/12/2011 Document Reviewed: 04/01/2013 °ExitCare® Patient Information ©2014 [employer]. ° °

## 2013-08-08 ENCOUNTER — Telehealth: Payer: Self-pay

## 2013-08-08 NOTE — Telephone Encounter (Signed)
Explained to pt to take colace and miralax  for constipation. Pt also inquired about her CT. Had Dr guest review...let pt know of large fibroids and advised to f/u with her gynecologist per dr guest.

## 2013-08-08 NOTE — Telephone Encounter (Signed)
Pt is very anxious to have a call back from dr guest  She needs to talk with him about what to take to help her go to the bathroom

## 2013-08-09 ENCOUNTER — Telehealth: Payer: Self-pay | Admitting: Radiology

## 2013-08-09 DIAGNOSIS — R102 Pelvic and perineal pain: Secondary | ICD-10-CM

## 2013-08-09 DIAGNOSIS — D219 Benign neoplasm of connective and other soft tissue, unspecified: Secondary | ICD-10-CM

## 2013-08-09 NOTE — Telephone Encounter (Signed)
Patient called and has a gynecologist in [place] (she travels between [facility] and [place] frequently)  Her gynecologist stated that she needs an [place] and maybe we could set that up since she will be here in [place] for the next 1-2 weeks.  Is this something we can go ahead and set up?

## 2013-08-12 ENCOUNTER — Telehealth: Payer: Self-pay

## 2013-08-12 NOTE — Telephone Encounter (Signed)
Left message to return call. Need to know what U/S he said she needed and for what?

## 2013-08-12 NOTE — Telephone Encounter (Signed)
Left message to return call. Need to know what U/S he said she needed and for what? 

## 2013-08-12 NOTE — Telephone Encounter (Signed)
Please get more info - she had an abdominal CT on 2/3 so I am not sure what will be on the [place] that was not visualized on the CT.  Although if her GYN wants this we can order it, just need to what we are looking for.

## 2013-08-12 NOTE — Telephone Encounter (Signed)
Pt is checking on her status of referral for an ultrasound no order has been placed yet

## 2013-08-12 NOTE — Telephone Encounter (Signed)
Patient returned call and said he said something about enlarged uterus, ? Fibroids? 

## 2013-08-12 NOTE — Telephone Encounter (Signed)
Patient returned call and said he said something about enlarged uterus, ? Fibroids?

## 2013-08-13 NOTE — Telephone Encounter (Signed)
Her CT shows fibroids. I'm not sure how an [place] is going to help. Does her GYN know that she had a CT? Would she like Korea to fax CT results to her for review?

## 2013-08-14 ENCOUNTER — Other Ambulatory Visit: Payer: Self-pay | Admitting: Radiology

## 2013-08-14 NOTE — Telephone Encounter (Signed)
Pelvic US scheduled.

## 2013-08-14 NOTE — Telephone Encounter (Signed)
According to pt her office already has a copy of the CT results. They would like to know more specific information about the fibroids. Report states numerous fibroids, but doctor's office wants [place] for further information, due to her pain she is still in.   The doctor's office suggested an U/S transvaginal.  Can we place this order for the patient?

## 2013-08-15 ENCOUNTER — Ambulatory Visit
Admission: RE | Admit: 2013-08-15 | Discharge: 2013-08-15 | Disposition: A | Discharge status: Home / Self Care | Payer: 59 | Source: Ambulatory Visit | Attending: Physician Assistant | Admitting: Physician Assistant

## 2013-08-15 ENCOUNTER — Telehealth: Payer: Self-pay | Admitting: Family Medicine

## 2013-08-15 DIAGNOSIS — D219 Benign neoplasm of connective and other soft tissue, unspecified: Secondary | ICD-10-CM

## 2013-08-15 DIAGNOSIS — R102 Pelvic and perineal pain: Secondary | ICD-10-CM

## 2013-08-15 NOTE — Telephone Encounter (Signed)
Please look at patients U\S and respond to it patient called about her results

## 2013-08-15 NOTE — Telephone Encounter (Signed)
Pt aware- has appt today at 230

## 2013-08-16 NOTE — Telephone Encounter (Signed)
I've asked that the results be sent to her GYN, who requested this study. The [place] confirms and details the fibroids noted on the CT scan. She needs to F/U with GYN, as previously advised.  IMPRESSION:  1. Enlarged multi fibroid uterus. At least 8 separate uterine  fibroids are identified ranging in size from 2.2 - 9.5 cm as  described above.  2. Poor visualization of the ovaries which are only seen on the  transabdominal images. They are grossly unremarkable.

## 2013-08-18 NOTE — Telephone Encounter (Signed)
lmom to cb. 

## 2013-08-19 NOTE — Telephone Encounter (Signed)
Pt.notified

## 2013-08-19 NOTE — Telephone Encounter (Signed)
Left message on machine to call back  

## 2014-11-20 ENCOUNTER — Ambulatory Visit (INDEPENDENT_AMBULATORY_CARE_PROVIDER_SITE_OTHER): Payer: BLUE CROSS/BLUE SHIELD | Admitting: Emergency Medicine

## 2014-11-20 VITALS — BP 134/74 | HR 78 | Temp 97.4°F | Resp 18 | Ht 65.0 in | Wt 230.0 lb

## 2014-11-20 DIAGNOSIS — W57XXXA Bitten or stung by nonvenomous insect and other nonvenomous arthropods, initial encounter: Secondary | ICD-10-CM

## 2014-11-20 DIAGNOSIS — S90561A Insect bite (nonvenomous), right ankle, initial encounter: Secondary | ICD-10-CM | POA: Diagnosis not present

## 2014-11-20 DIAGNOSIS — S90562A Insect bite (nonvenomous), left ankle, initial encounter: Secondary | ICD-10-CM | POA: Diagnosis not present

## 2014-11-20 MED ORDER — TRIAMCINOLONE ACETONIDE 0.1 % EX CREA: 1.0000 "application " | TOPICAL_CREAM | Freq: Two times a day (BID) | CUTANEOUS | Status: DC

## 2014-11-20 NOTE — Patient Instructions (Signed)
DEET Insect Repellent  DEET is a commonly used insect repellent. DEET is effective against mosquitoes, ticks, and chiggers.DEET is not effective against stinging insects, such as bees and wasps. When mosquitoes or ticks are active, take the following precautions.  Use DEET according to the directions on the label.  Wear protective clothing if you are outside in an area where there are weeds, tall grass, or bushes. This includes long pants, socks, and loose-fitting, long-sleeved shirts. Consider spraying DEET on your clothing. Avoid being outdoors in the early evening. This is when mosquitoes are most active.  Products with a low concentration of DEET (10% to 20%) may be useful in areas with few insects. Higher concentrations of DEET may be needed in areas with many insects. Repellents used on children should not contain more than 30% DEET. Although higher concentrations of DEET (up to 95%) are available for adults, they are not recommended for routine use. Concentrations higher than 50% do not provide additional protection. Depending on the concentration of DEET in a product, it can be effective for about 2 to 6 hours.  When applying DEET to children, use the lowest concentration that is effective. Ten percent DEET will last approximately 2 to 3 hours, while 30% will last 4 to 5 hours. Do not use DEET on infants younger than 2 months old. Do not apply DEET more often than once a day to children under the age of 2.  Avoid prolonged or excessive use of DEET. Use it sparingly to cover exposed skin and clothing. Adverse reactions to DEET in the recommended concentrations are uncommon. However, skin irritation can occur in some people.  Wash all treated skin and clothing with soap and water after returning indoors.  Do not allow children to apply insect repellent themselves.  Do not apply DEET near cuts or open wounds. You can apply DEET and sunscreen together. However, it is recommend that you apply  the sunscreen first.  Do not apply DEET to a child's hands or near a child's eyes and mouth. If DEET is accidentally sprayed in the eyes, wash the eyes out with large amounts of water.  Store DEET out of the reach of children.  Most authorities feel that it is safe to use DEET during pregnancy. However, pregnant women should only use insect repellents when they are in areas with a high risk of disease carried by insects (malaria, West Nile virus, encephalitis). Document Released: 03/15/2001 Document Revised: 11/04/2013 Document Reviewed: 03/09/2011 [facility] Patient Information 2015 [place], [place]. This information is not intended to replace advice given to you by your health care provider. Make sure you discuss any questions you have with your health care provider.

## 2014-11-20 NOTE — Progress Notes (Signed)
Subjective:  Patient ID: Joanna Gross, female    DOB: [birth year]  Age: 49 y.o. MRN: [id number]  CC: Insect Bite   HPI Joanna Gross presents for  Patient was [place] over the weekend and developed multiple insect bites on her ankles. She claims today she said  She was "bloated" from the insect stings. She said that she was stung by mosquitoes.    she was using topical over-the-counter hydrocortisone cream and Benadryl with no relief. She stated previously she is given and given a shot of cortisone for the  Rash.  It is her believe that she requires another injection of hydrocortisone product.  Outpatient Prescriptions Prior to Visit  Medication Sig Dispense Refill  . HYDROcodone-acetaminophen (HYCET) 7.5-325 mg/15 ml solution Take 5 mLs by mouth every 6 (six) hours as needed (or cough). (Patient not taking: Reported on 11/20/2014) 240 mL 0  . naphazoline-pheniramine (NAPHCON-A) 0.025-0.3 % ophthalmic solution 1 drop 4 (four) times daily as needed.     No facility-administered medications prior to visit.    ROS Review of Systems  Constitutional: Negative for fever, chills and appetite change.  HENT: Negative for congestion, ear pain, postnasal drip, sinus pressure and sore throat.   Eyes: Negative for pain and redness.  Respiratory: Negative for cough, shortness of breath and wheezing.   Cardiovascular: Negative for leg swelling.  Gastrointestinal: Negative for nausea, vomiting, abdominal pain, diarrhea, constipation and blood in stool.  Endocrine: Negative for polyuria.  Genitourinary: Negative for dysuria, urgency, frequency and flank pain.  Musculoskeletal: Negative for gait problem.  Skin: Negative for rash.  Neurological: Negative for weakness and headaches.  Psychiatric/Behavioral: Negative for confusion and decreased concentration. The patient is not nervous/anxious.     Objective:  BP 134/74 mmHg  Pulse 78  Temp(Src) 97.4 F (36.3 C) (Oral)   Resp 18  Ht 5\' 5"  (1.651 m)  Wt 230 lb (104.327 kg)  BMI 38.27 kg/m2  SpO2 98%  LMP 11/05/2014  BP Readings from Last 3 Encounters:  11/20/14 134/74  08/06/13 100/70  11/05/12 119/75    Wt Readings from Last 3 Encounters:  11/20/14 230 lb (104.327 kg)  08/06/13 217 lb (98.431 kg)  11/05/12 208 lb (94.348 kg)    Physical Exam  Constitutional: She is oriented to person, place, and time. She appears well-developed and well-nourished.  HENT:  Head: Normocephalic and atraumatic.  Eyes: Conjunctivae are normal. Pupils are equal, round, and reactive to light.  Pulmonary/Chest: Effort normal.  Musculoskeletal: She exhibits no edema.  Neurological: She is alert and oriented to person, place, and time.  Skin: Skin is dry. There is erythema.  Psychiatric: She has a normal mood and affect. Her behavior is normal. Thought content normal.    Lab Results  Component Value Date   WBC 4.0* 08/06/2013   HGB 9.9* 08/06/2013   HCT 34.6* 08/06/2013   GLUCOSE 83 08/06/2013   ALT 16 08/06/2013   AST 19 08/06/2013   NA 135 08/06/2013   K 4.1 08/06/2013   CL 101 08/06/2013   CREATININE 0.63 08/06/2013   BUN 4* 08/06/2013   CO2 27 08/06/2013   INR 0.9 05/13/2007      Assessment & Plan:   [name] was seen today for insect bite.  Diagnoses and all orders for this visit:  Insect bites  Other orders -     triamcinolone cream (KENALOG) 0.1 %; Apply 1 application topically 2 (two) times daily.   I am having  Ms. Flom start on triamcinolone cream. I am also having her maintain her naphazoline-pheniramine and HYDROcodone-acetaminophen.  Meds ordered this encounter  Medications  . triamcinolone cream (KENALOG) 0.1 %    Sig: Apply 1 application topically 2 (two) times daily.    Dispense:  30 g    Refill:  0     Follow-up: No Follow-up on file.  Roselee Culver, MD

## 2017-06-10 ENCOUNTER — Encounter (HOSPITAL_COMMUNITY): Payer: Self-pay | Admitting: Emergency Medicine

## 2017-06-10 ENCOUNTER — Ambulatory Visit (HOSPITAL_COMMUNITY)
Admission: EM | Admit: 2017-06-10 | Discharge: 2017-06-10 | Disposition: A | Discharge status: Home / Self Care | Payer: 59

## 2017-06-10 ENCOUNTER — Other Ambulatory Visit: Payer: Self-pay

## 2017-06-10 DIAGNOSIS — T07XXXA Unspecified multiple injuries, initial encounter: Secondary | ICD-10-CM

## 2017-06-10 DIAGNOSIS — T148XXA Other injury of unspecified body region, initial encounter: Secondary | ICD-10-CM

## 2017-06-10 NOTE — Discharge Instructions (Signed)
May take ibuprofen every 6 hours as needed for pain. May also take Tylenol every 4 hours. Ice to sore areas for maybe another day and then use heat. Muscle pain generally does better with heat. If you develop new symptoms or problems or bleeding, vomiting or signs of head injury go to the emergency department promptly.
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
## 2017-06-20 ENCOUNTER — Ambulatory Visit (HOSPITAL_COMMUNITY)
Admission: EM | Admit: 2017-06-20 | Discharge: 2017-06-20 | Disposition: A | Discharge status: Home / Self Care | Payer: Self-pay | Attending: Family Medicine | Admitting: Family Medicine

## 2017-06-20 ENCOUNTER — Ambulatory Visit (INDEPENDENT_AMBULATORY_CARE_PROVIDER_SITE_OTHER): Payer: Self-pay

## 2017-06-20 ENCOUNTER — Encounter (HOSPITAL_COMMUNITY): Payer: Self-pay | Admitting: Family Medicine

## 2017-06-20 DIAGNOSIS — M79644 Pain in right finger(s): Secondary | ICD-10-CM

## 2017-06-20 MED ORDER — MELOXICAM 7.5 MG PO TABS
7.5000 mg | ORAL_TABLET | Freq: Every day | ORAL | 0 refills | Status: DC
Start: 2017-06-20 — End: 2018-03-20
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
## 2017-06-20 NOTE — Discharge Instructions (Signed)
Xray showed some bone changes consistent with arthritis. No obvious foreign body seen. Take mobic as directed for 10 days. Continue heat/ice compress as needed. If symptoms do not improve, worsens, or does not resolve, follow up with hand orthopedics for further evaluation.   If experiencing worsening of symptoms, headache/blurry vision, nausea/vomiting, confusion/altered mental status, dizziness, weakness, passing out, imbalance, go to the emergency department for further evaluation.

## 2018-03-13 ENCOUNTER — Ambulatory Visit (HOSPITAL_COMMUNITY)
Admission: EM | Admit: 2018-03-13 | Discharge: 2018-03-13 | Disposition: A | Discharge status: Home / Self Care | Payer: Self-pay | Attending: Family Medicine | Admitting: Family Medicine

## 2018-03-13 ENCOUNTER — Encounter (HOSPITAL_COMMUNITY): Payer: Self-pay | Admitting: Emergency Medicine

## 2018-03-13 DIAGNOSIS — T7840XA Allergy, unspecified, initial encounter: Secondary | ICD-10-CM

## 2018-03-13 DIAGNOSIS — J040 Acute laryngitis: Secondary | ICD-10-CM

## 2018-03-13 MED ORDER — METHYLPREDNISOLONE ACETATE 80 MG/ML IJ SUSP
INTRAMUSCULAR | Status: AC
  Filled 2018-03-13: qty 1

## 2018-03-13 MED ORDER — METHYLPREDNISOLONE ACETATE 80 MG/ML IJ SUSP
80.0000 mg | Freq: Once | INTRAMUSCULAR | Status: AC
  Administered 2018-03-13: 80 mg via INTRAMUSCULAR

## 2018-03-13 NOTE — ED Provider Notes (Signed)
[place]    CSN: [id number] Arrival date & time: 03/13/18  5176     History   Chief Complaint Chief Complaint  Patient presents with  . Insect Bite    appt 1000    HPI Joanna Gross is a 52 y.o. female.   Patient is a 52 year old female that presents with laryngitis that started Saturday after she reports she breathed in some eucalyptus bug spray.  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  Reports that her throat has been a little irritated.  Denies any trouble swallowing, breathing or shortness of breath.  Slight tenderness and swelling to left lateral neck without erythema.  She is also complaining of right thigh tenderness and itching.  She was sitting on the couch last night and some sort of insect bit her.  She cleaned the area with alcohol and peroxide.  There is no obvious bite or swelling on the leg today.  Denies any fever, chills or body aches, fatigue.   ROS per HPI         Past Medical History:  Diagnosis Date  . Anemia   . Heart murmur   . Seizures ([place]) 1975   1 episode.  No meds since 1988    Patient Active Problem List   Diagnosis Date Noted  . Left breast mass 05/24/2011  . CHEST PAIN 05/16/2007    Past Surgical History:  Procedure Laterality Date  . BREAST SURGERY  nov. 2010   fibroid removed from right breast (3x)  . BREAST SURGERY  1989   fibroid removed from left breast  . BUNIONECTOMY  1999  . CYSTECTOMY  1974   left side of neck   . UTERINE FIBROID SURGERY  october 2003    OB History   None      Home Medications    Prior to Admission medications   Medication Sig Start Date End Date Taking? Authorizing Provider  meloxicam (MOBIC) 7.5 MG tablet Take 1 tablet (7.5 mg total) by mouth daily. 06/20/17   Ok Edwards, PA-C    Family History Family History  Problem Relation Age of Onset  . Arthritis Mother   . Hypertension Father     Social History Social History   Tobacco Use  . Smoking status: Never  Smoker  . Smokeless tobacco: Never Used  Substance Use Topics  . Alcohol use: Yes    Alcohol/week: 0.0 - 1.0 standard drinks  . Drug use: No     Allergies   Amoxicillin; Erythromycin; and Penicillins   Review of Systems Review of Systems   Physical Exam Triage Vital Signs ED Triage Vitals  Enc Vitals Group     BP 03/13/18 1018 115/70     Pulse Rate 03/13/18 1018 92     Resp 03/13/18 1018 18     Temp 03/13/18 1018 98.2 F (36.8 C)     Temp Source 03/13/18 1018 Oral     SpO2 03/13/18 1018 100 %     Weight --      Height --      Head Circumference --      Peak Flow --      Pain Score 03/13/18 1057 2     Pain Loc --      Pain Edu? --      Excl. in [address]? --    No data found.  Updated Vital Signs BP 115/70 (BP Location: Right Arm)   Pulse 92   Temp 98.2 F (36.8  C) (Oral)   Resp 18   SpO2 100%   Visual Acuity Right Eye Distance:   Left Eye Distance:   Bilateral Distance:    Right Eye Near:   Left Eye Near:    Bilateral Near:     Physical Exam  Constitutional: She is oriented to person, place, and time. She appears well-developed and well-nourished.  Very pleasant. Non toxic or ill appearing.     HENT:  Head: Normocephalic and atraumatic.  Mild posterior oropharyngeal erythema with 1+ tonsillar swelling. No exudates. No lymphadenopathy.   Eyes: Conjunctivae are normal.  Neck: Normal range of motion.  Mild swelling to left lateral neck. Non tender to palpation.   Cardiovascular: Normal rate and regular rhythm.  Pulmonary/Chest: Effort normal and breath sounds normal.  Lungs clear in all fields. No dyspnea or distress. No retractions or nasal flaring.     Musculoskeletal: Normal range of motion.  Neurological: She is alert and oriented to person, place, and time.  Skin: Skin is warm and dry.  No obvious erythema, swelling or punctures to the right upper thigh.   Psychiatric: She has a normal mood and affect.  Nursing note and vitals  reviewed.    UC Treatments / Results  Labs (all labs ordered are listed, but only abnormal results are displayed) Labs Reviewed - No data to display  EKG None  Radiology No results found.  Procedures Procedures (including critical care time)  Medications Ordered in UC Medications  methylPREDNISolone acetate (DEPO-MEDROL) injection 80 mg (80 mg Intramuscular Given 03/13/18 1056)    Initial Impression / Assessment and Plan / UC Course  I have reviewed the triage vital signs and the nursing notes.  Pertinent labs & imaging results that were available during my care of the patient were reviewed by me and considered in my medical decision making (see chart for details).     Laryngitis/allergic reaction- will go ahead and treat with steroid injection in the clinic.  Benadryl and zyrtec as needed Follow up as needed for continued or worsening symptoms  Final Clinical Impressions(s) / UC Diagnoses   Final diagnoses:  Laryngitis, acute  Allergic reaction, initial encounter     Discharge Instructions     It was nice meeting you!!  We will give you a steroid injection for the allergic reaction and laryngitis.. Continue the warm teas and other homeopathic treatments. He can also try Benadryl or Zyrtec. We will give you contact for an OB/GYN here in [facility] for follow-up on your fibroids. Follow up as needed for continued or worsening symptoms    ED Prescriptions    None     Controlled Substance Prescriptions [place] Controlled Substance Registry consulted? Not Applicable   Orvan July, NP 03/14/18 [phone]

## 2018-03-13 NOTE — ED Triage Notes (Signed)
Pt here for insect bite to right leg, hoarse voice and some abd pain

## 2018-03-13 NOTE — Discharge Instructions (Signed)
It was nice meeting you!!  We will give you a steroid injection for the allergic reaction and laryngitis.. Continue the warm teas and other homeopathic treatments. He can also try Benadryl or Zyrtec. We will give you contact for an OB/GYN here in [facility] for follow-up on your fibroids. Follow up as needed for continued or worsening symptoms

## 2018-03-13 NOTE — ED Notes (Signed)
Bed: UC01 Expected date:  Expected time:  Means of arrival:  Comments: Appt 

## 2018-03-20 ENCOUNTER — Other Ambulatory Visit: Payer: Self-pay

## 2018-03-20 ENCOUNTER — Encounter (HOSPITAL_COMMUNITY): Payer: Self-pay | Admitting: Emergency Medicine

## 2018-03-20 ENCOUNTER — Ambulatory Visit (HOSPITAL_COMMUNITY)
Admission: EM | Admit: 2018-03-20 | Discharge: 2018-03-20 | Disposition: A | Discharge status: Home / Self Care | Payer: Self-pay | Attending: Family Medicine | Admitting: Family Medicine

## 2018-03-20 DIAGNOSIS — R449 Unspecified symptoms and signs involving general sensations and perceptions: Secondary | ICD-10-CM

## 2018-03-20 DIAGNOSIS — R4189 Other symptoms and signs involving cognitive functions and awareness: Secondary | ICD-10-CM

## 2018-03-20 DIAGNOSIS — J040 Acute laryngitis: Secondary | ICD-10-CM

## 2018-03-20 MED ORDER — FUROSEMIDE 20 MG PO TABS: 20.0000 mg | ORAL_TABLET | Freq: Every day | ORAL | 0 refills | Status: DC

## 2018-03-20 MED ORDER — PREDNISONE 20 MG PO TABS: ORAL_TABLET | ORAL | 0 refills | Status: DC

## 2018-03-20 NOTE — ED Notes (Signed)
Bed: UC01 Expected date:  Expected time:  Means of arrival:  Comments: Appointments 

## 2018-03-20 NOTE — ED Provider Notes (Signed)
[facility]    CSN: [id number] Arrival date & time: 03/20/18  1035     History   Chief Complaint Chief Complaint  Patient presents with  . Appointment    10:30 am  . Hoarse    HPI Joanna Gross is a 52 y.o. female.   This 52 year old woman presents with persistent laryngitis since the onset on September 10, seemingly precipitated by eucalyptus bug spray and possibly a bug bite.  The lateral right thigh has a patch of numbness. 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    Note from ED visit on 9/10: Patient is a 52 year old female that presents with laryngitis that started Saturday after she reports she breathed in some eucalyptus bug spray.  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  Reports that her throat has been a little irritated.  Denies any trouble swallowing, breathing or shortness of breath.  Slight tenderness and swelling to left lateral neck without erythema.  She is also complaining of right thigh tenderness and itching.  She was sitting on the couch last night and some sort of insect bit her.  She cleaned the area with alcohol and peroxide.  There is no obvious bite or swelling on the leg today.  Denies any fever, chills or body aches, fatigue.      Past Medical History:  Diagnosis Date  . Anemia   . Heart murmur   . Seizures ([place]) 1975   1 episode.  No meds since 1988    Patient Active Problem List   Diagnosis Date Noted  . Left breast mass 05/24/2011  . CHEST PAIN 05/16/2007    Past Surgical History:  Procedure Laterality Date  . BREAST SURGERY  nov. 2010   fibroid removed from right breast (3x)  . BREAST SURGERY  1989   fibroid removed from left breast  . BUNIONECTOMY  1999  . CYSTECTOMY  1974   left side of neck   . UTERINE FIBROID SURGERY  october 2003    OB History   None      Home Medications    Prior to Admission medications   Medication Sig Start Date End Date Taking? Authorizing Provider  furosemide (LASIX) 20 MG  tablet Take 1 tablet (20 mg total) by mouth daily. 03/20/18   Robyn Haber, MD  predniSONE (DELTASONE) 20 MG tablet Two daily with food 03/20/18   Robyn Haber, MD    Family History Family History  Problem Relation Age of Onset  . Arthritis Mother   . Hypertension Father     Social History Social History   Tobacco Use  . Smoking status: Never Smoker  . Smokeless tobacco: Never Used  Substance Use Topics  . Alcohol use: Yes    Alcohol/week: 0.0 - 1.0 standard drinks  . Drug use: No     Allergies   Amoxicillin; Erythromycin; and Penicillins   Review of Systems Review of Systems   Physical Exam Triage Vital Signs ED Triage Vitals  Enc Vitals Group     BP 03/20/18 1058 108/78     Pulse Rate 03/20/18 1058 74     Resp 03/20/18 1058 18     Temp 03/20/18 1058 98.4 F (36.9 C)     Temp Source 03/20/18 1058 Oral     SpO2 03/20/18 1058 98 %     Weight --      Height --      Head Circumference --      Peak Flow --  Pain Score 03/20/18 1107 0     Pain Loc --      Pain Edu? --      Excl. in [place]? --    No data found.  Updated Vital Signs BP 108/78 (BP Location: Right Arm)   Pulse 74   Temp 98.4 F (36.9 C) (Oral)   Resp 18   SpO2 98%   Visual Acuity Right Eye Distance:   Left Eye Distance:   Bilateral Distance:    Right Eye Near:   Left Eye Near:    Bilateral Near:     Physical Exam  Constitutional: She is oriented to person, place, and time. She appears well-developed and well-nourished.  HENT:  Right Ear: External ear normal.  Left Ear: External ear normal.  Mouth/Throat: Oropharynx is clear and moist.  Eyes: Pupils are equal, round, and reactive to light. Conjunctivae and EOM are normal.  Neck: Normal range of motion. Neck supple.  Mildly tender right and lower central neck to palpation without adenopathy or thyromegaly  Cardiovascular: Normal rate, regular rhythm and normal heart sounds.  Pulmonary/Chest: Effort normal and breath sounds  normal.  Abdominal: Soft. Bowel sounds are normal. She exhibits distension. She exhibits no mass. There is tenderness. There is no rebound and no guarding.  Moderate bloating with diffuse mild tenderness  Musculoskeletal: Normal range of motion.  Neurological: She is alert and oriented to person, place, and time. A sensory deficit is present.  Decreased touch sensation lateral mid thigh  Skin: Skin is warm and dry.  Nursing note and vitals reviewed.    UC Treatments / Results  Labs (all labs ordered are listed, but only abnormal results are displayed) Labs Reviewed - No data to display  EKG None  Radiology No results found.  Procedures Procedures (including critical care time)  Medications Ordered in UC Medications - No data to display  Initial Impression / Assessment and Plan / UC Course  I have reviewed the triage vital signs and the nursing notes.  Pertinent labs & imaging results that were available during my care of the patient were reviewed by me and considered in my medical decision making (see chart for details).    Final Clinical Impressions(s) / UC Diagnoses   Final diagnoses:  Laryngitis  Sensory deficit, right   Discharge Instructions   None    ED Prescriptions    Medication Sig Dispense Auth. Provider   predniSONE (DELTASONE) 20 MG tablet Two daily with food 6 tablet Robyn Haber, MD   furosemide (LASIX) 20 MG tablet Take 1 tablet (20 mg total) by mouth daily. 5 tablet Robyn Haber, MD     Controlled Substance Prescriptions [place] Controlled Substance Registry consulted? Not Applicable   Robyn Haber, MD 03/20/18 1122

## 2018-03-20 NOTE — ED Triage Notes (Addendum)
Seen 9/10 for the same issues, but no better.  Patient reports an insect bite to right thigh same as last week and hoarseness is the same as last week .  Right thigh looks unremarkable.  Patient says it feels different to the touch, numb.  No visible sign of injury or mark.

## 2018-03-26 ENCOUNTER — Observation Stay (HOSPITAL_COMMUNITY)
Admission: EM | Admit: 2018-03-26 | Discharge: 2018-03-27 | Disposition: A | Discharge status: Home / Self Care | Payer: Self-pay | Attending: Internal Medicine | Admitting: Internal Medicine

## 2018-03-26 ENCOUNTER — Telehealth (HOSPITAL_COMMUNITY): Payer: Self-pay | Admitting: Emergency Medicine

## 2018-03-26 ENCOUNTER — Ambulatory Visit (HOSPITAL_COMMUNITY)
Admission: EM | Admit: 2018-03-26 | Discharge: 2018-03-26 | Disposition: A | Discharge status: Home / Self Care | Payer: Self-pay | Attending: Family Medicine | Admitting: Family Medicine

## 2018-03-26 ENCOUNTER — Encounter (HOSPITAL_COMMUNITY): Payer: Self-pay | Admitting: Emergency Medicine

## 2018-03-26 DIAGNOSIS — M1711 Unilateral primary osteoarthritis, right knee: Secondary | ICD-10-CM | POA: Insufficient documentation

## 2018-03-26 DIAGNOSIS — K8020 Calculus of gallbladder without cholecystitis without obstruction: Secondary | ICD-10-CM | POA: Insufficient documentation

## 2018-03-26 DIAGNOSIS — D649 Anemia, unspecified: Secondary | ICD-10-CM | POA: Diagnosis present

## 2018-03-26 DIAGNOSIS — Z880 Allergy status to penicillin: Secondary | ICD-10-CM | POA: Insufficient documentation

## 2018-03-26 DIAGNOSIS — Z8261 Family history of arthritis: Secondary | ICD-10-CM | POA: Insufficient documentation

## 2018-03-26 DIAGNOSIS — R140 Abdominal distension (gaseous): Secondary | ICD-10-CM | POA: Insufficient documentation

## 2018-03-26 DIAGNOSIS — N920 Excessive and frequent menstruation with regular cycle: Secondary | ICD-10-CM | POA: Insufficient documentation

## 2018-03-26 DIAGNOSIS — Z79899 Other long term (current) drug therapy: Secondary | ICD-10-CM | POA: Insufficient documentation

## 2018-03-26 DIAGNOSIS — Z881 Allergy status to other antibiotic agents status: Secondary | ICD-10-CM | POA: Insufficient documentation

## 2018-03-26 DIAGNOSIS — Z8249 Family history of ischemic heart disease and other diseases of the circulatory system: Secondary | ICD-10-CM | POA: Insufficient documentation

## 2018-03-26 DIAGNOSIS — D509 Iron deficiency anemia, unspecified: Principal | ICD-10-CM | POA: Insufficient documentation

## 2018-03-26 DIAGNOSIS — R490 Dysphonia: Secondary | ICD-10-CM | POA: Insufficient documentation

## 2018-03-26 DIAGNOSIS — D259 Leiomyoma of uterus, unspecified: Secondary | ICD-10-CM | POA: Insufficient documentation

## 2018-03-26 DIAGNOSIS — R2681 Unsteadiness on feet: Secondary | ICD-10-CM | POA: Insufficient documentation

## 2018-03-26 DIAGNOSIS — R52 Pain, unspecified: Secondary | ICD-10-CM

## 2018-03-26 LAB — COMPREHENSIVE METABOLIC PANEL
ALT: 16 U/L (ref 0–44)
ALT: 18 U/L (ref 0–44)
ANION GAP: 8 (ref 5–15)
AST: 23 U/L (ref 15–41)
AST: 37 U/L (ref 15–41)
Albumin: 4.1 g/dL (ref 3.5–5.0)
Albumin: 3.9 g/dL (ref 3.5–5.0)
Alkaline Phosphatase: 62 U/L (ref 38–126)
Alkaline Phosphatase: 56 U/L (ref 38–126)
Anion gap: 10 (ref 5–15)
BUN: 8 mg/dL (ref 6–20)
BUN: 9 mg/dL (ref 6–20)
CALCIUM: 9.6 mg/dL (ref 8.9–10.3)
CO2: 24 mmol/L (ref 22–32)
CO2: 26 mmol/L (ref 22–32)
Calcium: 9.5 mg/dL (ref 8.9–10.3)
Chloride: 101 mmol/L (ref 98–111)
Chloride: 103 mmol/L (ref 98–111)
Creatinine, Ser: 0.63 mg/dL (ref 0.44–1.00)
Creatinine, Ser: 0.74 mg/dL (ref 0.44–1.00)
GFR calc Af Amer: >60 mL/min (ref >60)
GFR calc non Af Amer: >60 mL/min (ref >60)
GFR calc non Af Amer: >60 mL/min (ref >60)
Glucose, Bld: 98 mg/dL (ref 70–99)
Glucose, Bld: 97 mg/dL (ref 70–99)
Potassium: 3.9 mmol/L (ref 3.5–5.1)
Potassium: 4.2 mmol/L (ref 3.5–5.1)
SODIUM: 137 mmol/L (ref 135–145)
Sodium: 135 mmol/L (ref 135–145)
TOTAL PROTEIN: 8.1 g/dL (ref 6.5–8.1)
Total Bilirubin: 0.3 mg/dL (ref 0.3–1.2)
Total Bilirubin: 0.8 mg/dL (ref 0.3–1.2)
Total Protein: 8 g/dL (ref 6.5–8.1)

## 2018-03-26 LAB — CBC WITH DIFFERENTIAL/PLATELET
BASOS PCT: 0 %
Basophils Absolute: 0 10*3/uL (ref 0.0–0.1)
Eosinophils Absolute: 0 10*3/uL (ref 0.0–0.7)
Eosinophils Relative: 1 %
HCT: 24.9 % — ABNORMAL LOW (ref 36.0–46.0)
Hemoglobin: 6.5 g/dL — CL (ref 12.0–15.0)
LYMPHS PCT: 37 %
Lymphs Abs: 1.6 10*3/uL (ref 0.7–4.0)
MCH: 16.9 pg — ABNORMAL LOW (ref 26.0–34.0)
MCHC: 26.1 g/dL — ABNORMAL LOW (ref 30.0–36.0)
MCV: 64.8 fL — ABNORMAL LOW (ref 78.0–100.0)
MONO ABS: 0.4 10*3/uL (ref 0.1–1.0)
Monocytes Relative: 10 %
NEUTROS ABS: 2.4 10*3/uL (ref 1.7–7.7)
Neutrophils Relative %: 52 %
Platelets: 315 10*3/uL (ref 150–400)
RBC: 3.84 MIL/uL — ABNORMAL LOW (ref 3.87–5.11)
RDW: 21.5 % — ABNORMAL HIGH (ref 11.5–15.5)
WBC: 4.4 10*3/uL (ref 4.0–10.5)

## 2018-03-26 LAB — VITAMIN B12
Vitamin B-12: 308 pg/mL (ref 180–914)
Vitamin B-12: 293 pg/mL (ref 180–914)

## 2018-03-26 LAB — POC URINE PREG, ED: Preg Test, Ur: NEGATIVE

## 2018-03-26 LAB — CBC
HCT: 25.5 % — ABNORMAL LOW (ref 36.0–46.0)
Hemoglobin: 6.5 g/dL — CL (ref 12.0–15.0)
MCH: 16.7 pg — ABNORMAL LOW (ref 26.0–34.0)
MCHC: 25.5 g/dL — ABNORMAL LOW (ref 30.0–36.0)
MCV: 65.6 fL — ABNORMAL LOW (ref 78.0–100.0)
Platelets: 290 10*3/uL (ref 150–400)
RBC: 3.89 MIL/uL (ref 3.87–5.11)
RDW: 22 % — ABNORMAL HIGH (ref 11.5–15.5)
WBC: 4 10*3/uL (ref 4.0–10.5)

## 2018-03-26 LAB — IRON AND TIBC
Iron: 12 ug/dL — ABNORMAL LOW (ref 28–170)
Saturation Ratios: 2 % — ABNORMAL LOW (ref 10.4–31.8)
TIBC: 553 ug/dL — AB (ref 250–450)
UIBC: 541 ug/dL

## 2018-03-26 LAB — RETICULOCYTES
RBC.: 3.81 MIL/uL — ABNORMAL LOW (ref 3.87–5.11)
Retic Count, Absolute: 53.3 10*3/uL (ref 19.0–186.0)
Retic Ct Pct: 1.4 % (ref 0.4–3.1)

## 2018-03-26 LAB — FOLATE: Folate: 12.5 ng/mL (ref >5.9)

## 2018-03-26 LAB — FERRITIN: Ferritin: 2 ng/mL — ABNORMAL LOW (ref 11–307)

## 2018-03-26 LAB — TSH: TSH: 1.583 u[IU]/mL (ref 0.350–4.500)

## 2018-03-26 LAB — ABO/RH: ABO/RH(D): A POS

## 2018-03-26 LAB — PREPARE RBC (CROSSMATCH)

## 2018-03-26 MED ORDER — DIPHENHYDRAMINE HCL 50 MG/ML IJ SOLN
25.0000 mg | Freq: Once | INTRAMUSCULAR | Status: AC
  Administered 2018-03-26: 25 mg via INTRAVENOUS
  Filled 2018-03-26: qty 1

## 2018-03-26 MED ORDER — FUROSEMIDE 40 MG PO TABS
20.0000 mg | ORAL_TABLET | Freq: Every day | ORAL | Status: DC
  Administered 2018-03-26: 20 mg via ORAL
  Filled 2018-03-26: qty 1

## 2018-03-26 MED ORDER — ACETAMINOPHEN 325 MG PO TABS
650.0000 mg | ORAL_TABLET | Freq: Four times a day (QID) | ORAL | Status: DC | PRN
  Administered 2018-03-27: 650 mg via ORAL
  Filled 2018-03-26: qty 2

## 2018-03-26 MED ORDER — SODIUM CHLORIDE 0.9 % IV SOLN
10.0000 mL/h | Freq: Once | INTRAVENOUS | Status: AC
  Administered 2018-03-26: 10 mL/h via INTRAVENOUS

## 2018-03-26 MED ORDER — ONDANSETRON HCL 4 MG/2ML IJ SOLN: 4.0000 mg | Freq: Four times a day (QID) | INTRAMUSCULAR | Status: DC | PRN

## 2018-03-26 NOTE — ED Provider Notes (Addendum)
[facility]    CSN: [id number] Arrival date & time: 03/26/18  1228     History   Chief Complaint Chief Complaint  Patient presents with  . Appointment    1230  . Hoarse    HPI Joanna Gross is a 52 y.o. female.   This 52 year old woman is here for persistent hoarseness for the last several weeks.  At the last visit, patient was treated with prednisone.  Patient still has some bloating as well as numbness on the right thigh.  The latter problem is getting better.  The Lasix was taken just one time and she said it made her very drowsy.  She did complete the prednisone.   Prior Urgent care notes: This 52 year old woman presents with persistent laryngitis since the onset on September 10, seemingly precipitated by eucalyptus bug spray and possibly a bug bite.  The lateral right thigh has a patch of numbness. 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    Note from ED visit on 9/10: Patient is a 52 year old female that presents with laryngitis that started Saturday after she reports she breathed in some eucalyptus bug spray.  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  Reports that her throat has been a little irritated.  Denies any trouble swallowing, breathing or shortness of breath.  Slight tenderness and swelling to left lateral neck without erythema.  She is also complaining of right thigh tenderness and itching.  She was sitting on the couch last night and some sort of insect bit her.  She cleaned the area with alcohol and peroxide.  There is no obvious bite or swelling on the leg today.  Denies any fever, chills or body aches, fatigue.      Past Medical History:  Diagnosis Date  . Anemia   . Heart murmur   . Seizures ([place]) 1975   1 episode.  No meds since 1988    Patient Active Problem List   Diagnosis Date Noted  . Left breast mass 05/24/2011  . CHEST PAIN 05/16/2007    Past Surgical History:  Procedure Laterality Date  . BREAST SURGERY  nov. 2010     fibroid removed from right breast (3x)  . BREAST SURGERY  1989   fibroid removed from left breast  . BUNIONECTOMY  1999  . CYSTECTOMY  1974   left side of neck   . UTERINE FIBROID SURGERY  october 2003    OB History   None      Home Medications    Prior to Admission medications   Not on File    Family History Family History  Problem Relation Age of Onset  . Arthritis Mother   . Hypertension Father     Social History Social History   Tobacco Use  . Smoking status: Never Smoker  . Smokeless tobacco: Never Used  Substance Use Topics  . Alcohol use: Yes    Alcohol/week: 0.0 - 1.0 standard drinks  . Drug use: No     Allergies   Amoxicillin; Erythromycin; and Penicillins   Review of Systems Review of Systems  Gastrointestinal: Positive for abdominal distention.  All other systems reviewed and are negative.    Physical Exam Triage Vital Signs ED Triage Vitals [03/26/18 1245]  Enc Vitals Group     BP 119/75     Pulse Rate 98     Resp 16     Temp 98.6 F (37 C)     Temp src  SpO2 100 %     Weight      Height      Head Circumference      Peak Flow      Pain Score      Pain Loc      Pain Edu?      Excl. in [place]?    No data found.  Updated Vital Signs BP 119/75   Pulse 98   Temp 98.6 F (37 C)   Resp 16   SpO2 100%   Physical Exam  Constitutional: She is oriented to person, place, and time. She appears well-developed and well-nourished.  HENT:  Right Ear: External ear normal.  Left Ear: External ear normal.  Mouth/Throat: Oropharynx is clear and moist.  Eyes: Pupils are equal, round, and reactive to light. Conjunctivae and EOM are normal.  Neck: Normal range of motion. Neck supple. No thyromegaly present.  Pulmonary/Chest: Effort normal.  Musculoskeletal: Normal range of motion.  Lymphadenopathy:    She has no cervical adenopathy.  Neurological: She is alert and oriented to person, place, and time. A sensory deficit is present.   Skin: Skin is warm and dry.  Nursing note and vitals reviewed.    UC Treatments / Results  Labs (all labs ordered are listed, but only abnormal results are displayed) Labs Reviewed  CBC - Abnormal; Notable for the following components:      Result Value   Hemoglobin 6.5 (*)    HCT 25.5 (*)    MCV 65.6 (*)    MCH 16.7 (*)    MCHC 25.5 (*)    RDW 22.0 (*)    All other components within normal limits  COMPREHENSIVE METABOLIC PANEL  TSH  VITAMIN B12    EKG None  Radiology No results found.  Procedures Procedures (including critical care time)  Medications Ordered in UC Medications - No data to display  Initial Impression / Assessment and Plan / UC Course  I have reviewed the triage vital signs and the nursing notes.  Pertinent labs & imaging results that were available during my care of the patient were reviewed by me and considered in my medical decision making (see chart for details).    Final Clinical Impressions(s) / UC Diagnoses   Final diagnoses:  Hoarseness of voice  Bloating     Discharge Instructions     We are running blood tests to evaluate the bloating and numbness.  Please make an appointment with Dr. Constance Holster for evaluation of the hoarseness.    ED Prescriptions    None     Controlled Substance Prescriptions [place] Controlled Substance Registry consulted? Not Applicable   Robyn Haber, MD 03/26/18 1308    Robyn Haber, MD 03/26/18 1410

## 2018-03-26 NOTE — ED Notes (Signed)
Writer unable too complete ISTAT Beta test due to not enough blood in tube

## 2018-03-26 NOTE — ED Notes (Signed)
Bed: UC01 Expected date:  Expected time:  Means of arrival:  Comments: 

## 2018-03-26 NOTE — Telephone Encounter (Signed)
Left message for pt to return call.

## 2018-03-26 NOTE — Discharge Instructions (Addendum)
We are running blood tests to evaluate the bloating and numbness.  Please make an appointment with Dr. Constance Holster for evaluation of the hoarseness.

## 2018-03-26 NOTE — ED Triage Notes (Signed)
Per pt, states she was seen by her PCP today-was told her hgb was 6.5 and she needed to come to ED for transfusion-states recently on lasix and prednisone for an allergic reaction to eucalyptus-no other symptoms-states heavy periods-last one was 2 weeks ago

## 2018-03-26 NOTE — H&P (Signed)
History and Physical  ADMIRE BUNNELL SEG:[id number] DOB: 05-11-1966 DOA: 03/26/2018  Referring physician: Dr Benjamine Mola  PCP: Hayden Rasmussen, MD  Outpatient Specialists: ObGyn Patient coming from: Home  Chief Complaint: Anemia-asked to come to the ED for blood transfusion  HPI: Joanna Gross is a 52 y.o. female with medical history significant for microcytic anemia, menorrhagia, who presented to [facility] ED from urgent care where she went to follow up on treatment for allergic reaction to [facility] flavor that took place last week. Was told at Urgent Care that she is anemic, needed blood transfusion and to report to the ED.  She endorses regular and heavy menses lasting roughly 6 days per month.  Denies melena or hematochezia.  She completed 3 day course of prednisone.  Denies any abdominal pain or nausea.  Denies overt bleeding.  Admits to dyspnea on ambulation and persitent fatigue for the last couple of weeks.  No chest pain or palpitations.   ED Course: Upon presentation to the ED, the patient's vital signs are stable.  Hemoglobin 6.5 and MCV 64.  Iron studies ordered by ED physician.  TRH asked to admit for symptomatic anemia.  Review of Systems: Review of systems as noted in the HPI. All other systems reviewed and are negative.   Past Medical History:  Diagnosis Date  . Anemia   . Heart murmur   . Seizures ([place]) 1975   1 episode.  No meds since 1988   Past Surgical History:  Procedure Laterality Date  . BREAST SURGERY  nov. 2010   fibroid removed from right breast (3x)  . BREAST SURGERY  1989   fibroid removed from left breast  . BUNIONECTOMY  1999  . CYSTECTOMY  1974   left side of neck   . UTERINE FIBROID SURGERY  october 2003    Social History:  reports that she has never smoked. She has never used smokeless tobacco. She reports that she drinks alcohol. She reports that she does not use drugs.   Allergies  Allergen Reactions  . Amoxicillin Anaphylaxis   Shortness of breath, boils Has patient had a PCN reaction causing immediate rash, facial/tongue/throat swelling, SOB or lightheadedness with hypotension: Yes Has patient had a PCN reaction causing severe rash involving mucus membranes or skin necrosis: Yes Has patient had a PCN reaction that required hospitalization: Yes Has patient had a PCN reaction occurring within the last 10 years: No If all of the above answers are "NO", then may proceed with Cephalosporin use.   Marland Kitchen Penicillins Anaphylaxis    Shortness of breath, boils  Has patient had a PCN reaction causing immediate rash, facial/tongue/throat swelling, SOB or lightheadedness with hypotension: Yes Has patient had a PCN reaction causing severe rash involving mucus membranes or skin necrosis: Yes Has patient had a PCN reaction that required hospitalization: Yes Has patient had a PCN reaction occurring within the last 10 years: No If all of the above answers are "NO", then may proceed with Cephalosporin use.   . Bee Venom Swelling  . Erythromycin     Shortness of breath, boils  . Eucalyptus Flavor [Flavoring Agent]     Hoarse voice, lip burning    Family History  Problem Relation Age of Onset  . Arthritis Mother   . Hypertension Father       Prior to Admission medications   Medication Sig Start Date End Date Taking? Authorizing Provider  furosemide (LASIX) 20 MG tablet Take 20 mg by mouth daily.   Yes  [provider]    Physical Exam: BP 127/74   Pulse 71   Temp 98.7 F (37.1 C) (Oral)   Resp 15   Ht 5' 5.5" (1.664 m)   Wt 97.1 kg   LMP 03/12/2018   SpO2 100%   BMI 35.07 kg/m   . General: 52 y.o. year-old female well developed well nourished in no acute distress.  Alert and oriented x3.  Voice is hoarse however there are no erythema or swelling in the back of her throat. No jaundice noted. . Cardiovascular: Regular rate and rhythm with no rubs or gallops.  No thyromegaly or JVD noted.  No lower extremity  edema. 2/4 pulses in all 4 extremities. Marland Kitchen Respiratory: Clear to auscultation with no wheezes or rales. Good inspiratory effort. . Abdomen: Soft nontender nondistended with normal bowel sounds x4 quadrants. . Muskuloskeletal: No cyanosis, clubbing or edema noted bilaterally . Neuro: CN II-XII intact, strength, sensation, reflexes . Skin: No ulcerative lesions noted or rashes. No jaundice. Marland Kitchen Psychiatry: Judgement and insight appear normal. Mood is appropriate for condition and setting          Labs on Admission:  Basic Metabolic Panel: Recent Labs  Lab 03/26/18 1302 03/26/18 1531  NA 135 137  K 3.9 4.2  CL 101 103  CO2 24 26  GLUCOSE 98 97  BUN 8 9  CREATININE 0.63 0.74  CALCIUM 9.5 9.6   Liver Function Tests: Recent Labs  Lab 03/26/18 1302 03/26/18 1531  AST 23 37  ALT 16 18  ALKPHOS 62 56  BILITOT 0.3 0.8  PROT 8.0 8.1  ALBUMIN 4.1 3.9   No results for input(s): LIPASE, AMYLASE in the last 168 hours. No results for input(s): AMMONIA in the last 168 hours. CBC: Recent Labs  Lab 03/26/18 1302 03/26/18 1538  WBC 4.0 4.4  NEUTROABS  --  2.4  HGB 6.5* 6.5*  HCT 25.5* 24.9*  MCV 65.6* 64.8*  PLT 290 315   Cardiac Enzymes: No results for input(s): CKTOTAL, CKMB, CKMBINDEX, TROPONINI in the last 168 hours.  BNP (last 3 results) No results for input(s): BNP in the last 8760 hours.  ProBNP (last 3 results) No results for input(s): PROBNP in the last 8760 hours.  CBG: No results for input(s): GLUCAP in the last 168 hours.  Radiological Exams on Admission: No results found.  EKG: I independently viewed the EKG done and my findings are as followed: No EKG available at the time of this encounter.  Assessment/Plan Present on Admission: . Symptomatic anemia  Active Problems:   Symptomatic anemia  Symptomatic microcytic anemia HemoGlobin 6.5 on presentation with MCV 64 Baseline hemoglobin 9 Suspect 2/2 to menorrhagia Still has regular menses Has never  had a colonoscopy FOBT pending Iron studies pending Transfuse 2 unit PRBCs Repeat CBC in the morning PT to assess in the morning for any immediate needs Possible discharge tomorrow 03/27/2018  Hoarseness suspect secondary to recent allergic reaction to eucalyptus flavor Completed 3 days of oral steroid Denies any significant discomfort No erythema, exudate, or swelling No odynophagia  Obesity BMI 35 Recommend weight loss outpatient with regular physical activity and healthy dieting   DVT prophylaxis: SCDs  Code Status: Full code  Family Communication: Mother at bedside  Disposition Plan: Admit to [place] called: None  Admission status: Observation status    Kayleen Memos MD [facility] Pager [phone]  If 7PM-7AM, please contact night-coverage [email or web address] Password [email or web address]  03/26/2018, 5:50 PM

## 2018-03-26 NOTE — Progress Notes (Signed)
Received report; pending transport

## 2018-03-26 NOTE — ED Notes (Signed)
Pt reports feeling generalized weakness and fatigue the last few days leading up to the doctors appt.

## 2018-03-26 NOTE — Telephone Encounter (Signed)
Pt called back informed of abnormal lab and to report to ED for further eval and transfusion

## 2018-03-26 NOTE — ED Triage Notes (Signed)
Pt here for recheck, states her voice is still hoarse x3 weeks.

## 2018-03-26 NOTE — ED Notes (Signed)
Transport called for pt.

## 2018-03-26 NOTE — ED Notes (Signed)
CRITICAL VALUE ALERT  Critical Value: 6.5 HGB  Date & Time Notied:  03/26/2018 1702  Provider Notified: Benjamine Mola, [place]

## 2018-03-26 NOTE — ED Provider Notes (Signed)
[place] DEPT Provider Note   CSN: [id number] Arrival date & time: 03/26/18  1505     History   Chief Complaint Chief Complaint  Patient presents with  . abnormal labs    HPI Joanna Gross is a 52 y.o. female with a past medical history of anemia secondary to fibroids and heavy menstrual cycles, who presents today for evaluation of abnormal labs.  She went to the urgent care center today for hoarseness since early September, thought to be secondary to a reaction to eucalyptus oil.  While there she had screening labs obtained and was called and told that she was anemic and needed to come to the emergency room.  She reports that for the past few weeks she has had gradually worsening dyspnea with exertion, fatigue, and feeling generally tired.  She denies dark, tary sticky bowel movements. She reports that her menstrual cycles are lighter now.    She reports that the only meat she eats is fish, does not eat any red meat.  She is not taking iron supplements.  She has never had a transfusion before.    HPI  Past Medical History:  Diagnosis Date  . Anemia   . Heart murmur   . Seizures ([place]) 1975   1 episode.  No meds since 1988    Patient Active Problem List   Diagnosis Date Noted  . Left breast mass 05/24/2011  . CHEST PAIN 05/16/2007    Past Surgical History:  Procedure Laterality Date  . BREAST SURGERY  nov. 2010   fibroid removed from right breast (3x)  . BREAST SURGERY  1989   fibroid removed from left breast  . BUNIONECTOMY  1999  . CYSTECTOMY  1974   left side of neck   . UTERINE FIBROID SURGERY  october 2003     OB History   None      Home Medications    Prior to Admission medications   Medication Sig Start Date End Date Taking? Authorizing Provider  furosemide (LASIX) 20 MG tablet Take 20 mg by mouth daily.   Yes [provider]    Family History Family History  Problem Relation Age of Onset  . Arthritis  Mother   . Hypertension Father     Social History Social History   Tobacco Use  . Smoking status: Never Smoker  . Smokeless tobacco: Never Used  Substance Use Topics  . Alcohol use: Yes    Alcohol/week: 0.0 - 1.0 standard drinks  . Drug use: No     Allergies   Amoxicillin; Penicillins; Bee venom; Erythromycin; and Eucalyptus flavor [flavoring agent]   Review of Systems Review of Systems  Constitutional: Negative for chills and fever.       Feeling tired  HENT: Positive for voice change (For 3 weeks. ). Negative for congestion.   Respiratory: Negative for chest tightness and shortness of breath.   Gastrointestinal: Negative for abdominal pain, anal bleeding, blood in stool, nausea and vomiting.  Genitourinary: Negative for hematuria, vaginal bleeding and vaginal discharge.  Neurological: Positive for weakness (Generally). Negative for dizziness, light-headedness and headaches.  All other systems reviewed and are negative.    Physical Exam Updated Vital Signs BP 127/74   Pulse 71   Temp 98.7 F (37.1 C) (Oral)   Resp 15   Ht 5' 5.5" (1.664 m)   Wt 97.1 kg   LMP 03/12/2018   SpO2 100%   BMI 35.07 kg/m   Physical Exam  Constitutional: She is oriented to person, place, and time. She appears well-developed and well-nourished. No distress.  HENT:  Head: Normocephalic and atraumatic.  Mouth/Throat: Mucous membranes are pale.  Eyes:  Conjunctiva are pale.   Neck: Neck supple.  Cardiovascular: Normal rate, regular rhythm, normal heart sounds and intact distal pulses.  No murmur heard. Pulmonary/Chest: Effort normal and breath sounds normal. No stridor. No respiratory distress.  Abdominal: Soft. There is no tenderness.  Musculoskeletal: She exhibits no edema.  Neurological: She is alert and oriented to person, place, and time.  Skin: Skin is warm and dry.  Psychiatric: She has a normal mood and affect.  Nursing note and vitals reviewed.    ED Treatments /  Results  Labs (all labs ordered are listed, but only abnormal results are displayed) Labs Reviewed  CBC WITH DIFFERENTIAL/PLATELET - Abnormal; Notable for the following components:      Result Value   RBC 3.84 (*)    Hemoglobin 6.5 (*)    HCT 24.9 (*)    MCV 64.8 (*)    MCH 16.9 (*)    MCHC 26.1 (*)    RDW 21.5 (*)    All other components within normal limits  RETICULOCYTES - Abnormal; Notable for the following components:   RBC. 3.81 (*)    All other components within normal limits  COMPREHENSIVE METABOLIC PANEL  VITAMIN H67  FOLATE  IRON AND TIBC  FERRITIN  I-STAT BETA HCG BLOOD, ED ([facility], [facility], [facility] ONLY)  POC URINE PREG, ED  POC OCCULT BLOOD, ED  TYPE AND SCREEN  PREPARE RBC (CROSSMATCH)    EKG None  Radiology Dg Femur Port, 1v Right  Result Date: 03/27/2018 CLINICAL DATA:  Pain along the right femur laterally EXAM: RIGHT FEMUR PORTABLE 1 VIEW COMPARISON:  None. FINDINGS: The right femur is intact. Only mild degenerative change of the right hip joint is seen. However there is degenerative joint disease involving the right knee primarily involving the medial compartment where there is considerable loss of joint space and sclerosis with spurring present. Also small loose bodies cannot be excluded within the right knee joint space. IMPRESSION: 1. Negative right femur. 2. Degenerative change involves the right knee joint space particularly medially. Electronically Signed   By: Ivar Drape M.D.   On: 03/27/2018 12:21    Procedures Procedures (including critical care time) CRITICAL CARE Performed by: Wyn Quaker Total critical care time: 40 minutes Critical care time was exclusive of separately billable procedures and treating other patients. Critical care was necessary to treat or prevent imminent or life-threatening deterioration. Critical care was time spent personally by me on the following activities: development of treatment plan with patient and/or surrogate as well  as nursing, discussions with consultants, evaluation of patient's response to treatment, examination of patient, obtaining history from patient or surrogate, ordering and performing treatments and interventions, ordering and review of laboratory studies, ordering and review of radiographic studies, pulse oximetry and re-evaluation of patient's condition.   Medications Ordered in ED Medications     Initial Impression / Assessment and Plan / ED Course  I have reviewed the triage vital signs and the nursing notes.  Pertinent labs & imaging results that were available during my care of the patient were reviewed by me and considered in my medical decision making (see chart for details).  Clinical Course as of Mar 27 1634  Mon Mar 26, 2018  1717 Discussed results and plan to transfuse with patient.   [EH]  Moody  with hospitalist, will admit for symptomatic anemia.     [EH]    Clinical Course User Index [EH] Lorin Glass, PA-C   Patient presents today for evaluation of anemia.  She has recently been taking prednisone.  Labs obtained by urgent care were reviewed earlier, showing a normal white count.  As I would expect that her white count would be significantly elevated based on prednisone, will repeat CBC here prior to transfusion to confirm anemia.    Repeat CBC confirmed anemia.  Anemia is slightly below 7, however based on her symptoms will transfuse and admit.  Suspect that this is secondary to diet and poor iron intake. Hospitalist consulted for admission who agreed to admit patient.  Anemia panel pending.        Final Clinical Impressions(s) / ED Diagnoses   Final diagnoses:  Anemia, unspecified type    ED Discharge Orders    None       Lorin Glass, Hershal Coria 03/27/18 1637    Maudie Flakes, MD 03/29/18 [phone]

## 2018-03-26 NOTE — ED Notes (Signed)
ED TO INPATIENT HANDOFF REPORT  Name/Age/Gender Joanna Gross 52 y.o. female  Code Status    Code Status Orders  (From admission, onward)         Start     Ordered   03/26/18 1750  Full code  Continuous     03/26/18 1750        Code Status History    This patient has a current code status but no historical code status.      Home/SNF/Other Home  Chief Complaint blood transfusion  Level of Care/Admitting Diagnosis ED Disposition    ED Disposition Condition [facility] Area: [facility] [063016]  Level of Care: Med-Surg [16]  Diagnosis: Symptomatic anemia [0109323]  Admitting Physician: Kayleen Memos [[id number]]  Attending Physician: Kayleen Memos [[id number]]  PT Class (Do Not Modify): Observation [104]  PT Acc Code (Do Not Modify): Observation [10022]       Medical History Past Medical History:  Diagnosis Date  . Anemia   . Heart murmur   . Seizures ([place]) 1975   1 episode.  No meds since 1988    Allergies Allergies  Allergen Reactions  . Amoxicillin Anaphylaxis    Shortness of breath, boils Has patient had a PCN reaction causing immediate rash, facial/tongue/throat swelling, SOB or lightheadedness with hypotension: Yes Has patient had a PCN reaction causing severe rash involving mucus membranes or skin necrosis: Yes Has patient had a PCN reaction that required hospitalization: Yes Has patient had a PCN reaction occurring within the last 10 years: No If all of the above answers are "NO", then may proceed with Cephalosporin use.   Marland Kitchen Penicillins Anaphylaxis    Shortness of breath, boils  Has patient had a PCN reaction causing immediate rash, facial/tongue/throat swelling, SOB or lightheadedness with hypotension: Yes Has patient had a PCN reaction causing severe rash involving mucus membranes or skin necrosis: Yes Has patient had a PCN reaction that required hospitalization: Yes Has patient had a PCN reaction occurring  within the last 10 years: No If all of the above answers are "NO", then may proceed with Cephalosporin use.   . Bee Venom Swelling  . Erythromycin     Shortness of breath, boils  . Eucalyptus Flavor [Flavoring Agent]     Hoarse voice, lip burning    IV Location/Drains/Wounds Patient Lines/Drains/Airways Status   Active Line/Drains/Airways    Name:   Placement date:   Placement time:   Site:   Days:   Peripheral IV 03/26/18 Left Antecubital   03/26/18    1629    Antecubital   less than 1   Peripheral IV 03/26/18 Right Forearm   03/26/18    1743    Forearm   less than 1          Labs/Imaging Results for orders placed or performed during the hospital encounter of 03/26/18 (from the past 48 hour(s))  Comprehensive metabolic panel     Status: None   Collection Time: 03/26/18  3:31 PM  Result Value Ref Range   Sodium 137 135 - 145 mmol/L   Potassium 4.2 3.5 - 5.1 mmol/L   Chloride 103 98 - 111 mmol/L   CO2 26 22 - 32 mmol/L   Glucose, Bld 97 70 - 99 mg/dL   BUN 9 6 - 20 mg/dL   Creatinine, Ser 0.74 0.44 - 1.00 mg/dL   Calcium 9.6 8.9 - 10.3 mg/dL   Total Protein 8.1 6.5 - 8.1  g/dL   Albumin 3.9 3.5 - 5.0 g/dL   AST 37 15 - 41 U/L   ALT 18 0 - 44 U/L   Alkaline Phosphatase 56 38 - 126 U/L   Total Bilirubin 0.8 0.3 - 1.2 mg/dL   GFR calc non Af Amer >60 >60 mL/min   GFR calc Af Amer >60 >60 mL/min    Comment: (NOTE) The eGFR has been calculated using the CKD EPI equation. This calculation has not been validated in all clinical situations. eGFR's persistently <60 mL/min signify possible Chronic Kidney Disease.    Anion gap 8 5 - 15    Comment: Performed at [facility], [place] [address], [place], [place] [address]  Type and screen [place]     Status: None (Preliminary result)   Collection Time: 03/26/18  3:31 PM  Result Value Ref Range   ABO/RH(D) A POS    Antibody Screen PENDING    Sample Expiration      03/29/2018 Performed at  [facility], [place] [address], [place], [place] [address]   CBC with Differential     Status: Abnormal   Collection Time: 03/26/18  3:38 PM  Result Value Ref Range   WBC 4.4 4.0 - 10.5 K/uL   RBC 3.84 (L) 3.87 - 5.11 MIL/uL   Hemoglobin 6.5 (LL) 12.0 - 15.0 g/dL    Comment: REPEATED TO VERIFY CRITICAL RESULT CALLED TO, READ BACK BY AND VERIFIED WITH: Carlean Crowl,RN [id number] @ 1700 BY J SCOTTON    HCT 24.9 (L) 36.0 - 46.0 %   MCV 64.8 (L) 78.0 - 100.0 fL   MCH 16.9 (L) 26.0 - 34.0 pg   MCHC 26.1 (L) 30.0 - 36.0 g/dL   RDW 21.5 (H) 11.5 - 15.5 %   Platelets 315 150 - 400 K/uL   Neutrophils Relative % 52 %   Lymphocytes Relative 37 %   Monocytes Relative 10 %   Eosinophils Relative 1 %   Basophils Relative 0 %   Neutro Abs 2.4 1.7 - 7.7 K/uL   Lymphs Abs 1.6 0.7 - 4.0 K/uL   Monocytes Absolute 0.4 0.1 - 1.0 K/uL   Eosinophils Absolute 0.0 0.0 - 0.7 K/uL   Basophils Absolute 0.0 0.0 - 0.1 K/uL   RBC Morphology POLYCHROMASIA PRESENT     Comment: TARGET CELLS ELLIPTOCYTES    Smear Review PLATELET COUNT CONFIRMED BY SMEAR     Comment: Performed at [facility], [place] [address], [place], [place] [address]  Reticulocytes     Status: Abnormal   Collection Time: 03/26/18  3:52 PM  Result Value Ref Range   Retic Ct Pct 1.4 0.4 - 3.1 %   RBC. 3.81 (L) 3.87 - 5.11 MIL/uL   Retic Count, Absolute 53.3 19.0 - 186.0 K/uL    Comment: Performed at [facility], [place] [address], [place],  [address]  POC Urine Pregnancy, ED (do NOT order at [facility])     Status: None   Collection Time: 03/26/18  4:49 PM  Result Value Ref Range   Preg Test, Ur NEGATIVE NEGATIVE    Comment:        THE SENSITIVITY OF THIS METHODOLOGY IS >24 mIU/mL   Prepare RBC     Status: None   Collection Time: 03/26/18  5:31 PM  Result Value Ref Range   Order Confirmation      ORDER PROCESSED BY BLOOD BANK Performed at [facility],  [place] [address], [place], [place] [address]    No results found.  Pending Labs Unresulted Labs (From admission, onward)    Start     Ordered   03/26/18 1552  Vitamin B12  (Anemia Panel (PNL))  STAT,   STAT     03/26/18 1551   03/26/18 1552  Folate  (Anemia Panel (PNL))  STAT,   STAT     03/26/18 1551   03/26/18 1552  Iron and TIBC  (Anemia Panel (PNL))  STAT,   STAT     03/26/18 1551   03/26/18 1552  Ferritin  (Anemia Panel (PNL))  STAT,   STAT     03/26/18 1551          Vitals/Pain Today's Vitals   03/26/18 1524 03/26/18 1528 03/26/18 1630 03/26/18 1633  BP: (!) 159/95   127/74  Pulse: 84   71  Resp: 19   15  Temp: 98.7 F (37.1 C)     TempSrc: Oral     SpO2: 100%   100%  Weight: 97.1 kg     Height: 5' 5.5" (1.664 m)     PainSc:  0-No pain 0-No pain     Isolation Precautions No active isolations  Medications Medications  0.9 %  sodium chloride infusion (has no administration in time range)    Mobility walks

## 2018-03-27 ENCOUNTER — Other Ambulatory Visit: Payer: Self-pay

## 2018-03-27 ENCOUNTER — Observation Stay (HOSPITAL_COMMUNITY): Payer: Self-pay

## 2018-03-27 DIAGNOSIS — R52 Pain, unspecified: Secondary | ICD-10-CM

## 2018-03-27 LAB — CBC
HEMATOCRIT: 31 % — AB (ref 36.0–46.0)
Hemoglobin: 8.9 g/dL — ABNORMAL LOW (ref 12.0–15.0)
MCH: 19.5 pg — ABNORMAL LOW (ref 26.0–34.0)
MCHC: 28.7 g/dL — ABNORMAL LOW (ref 30.0–36.0)
MCV: 67.8 fL — ABNORMAL LOW (ref 78.0–100.0)
Platelets: 265 10*3/uL (ref 150–400)
RBC: 4.57 MIL/uL (ref 3.87–5.11)
RDW: 23.6 % — AB (ref 11.5–15.5)
WBC: 5.5 10*3/uL (ref 4.0–10.5)

## 2018-03-27 MED ORDER — IOHEXOL 300 MG/ML  SOLN
100.0000 mL | Freq: Once | INTRAMUSCULAR | Status: AC | PRN
  Administered 2018-03-27: 100 mL via INTRAVENOUS

## 2018-03-27 MED ORDER — FERROUS SULFATE 325 (65 FE) MG PO TABS: 325.0000 mg | ORAL_TABLET | Freq: Every day | ORAL | 0 refills | Status: DC

## 2018-03-27 MED ORDER — IOPAMIDOL (ISOVUE-300) INJECTION 61%
30.0000 mL | Freq: Once | INTRAVENOUS | Status: AC | PRN
  Administered 2018-03-27: 30 mL via ORAL

## 2018-03-27 MED ORDER — MORPHINE SULFATE (PF) 2 MG/ML IV SOLN
2.0000 mg | Freq: Once | INTRAVENOUS | Status: AC
  Administered 2018-03-27: 2 mg via INTRAVENOUS
  Filled 2018-03-27: qty 1

## 2018-03-27 MED ORDER — IOPAMIDOL (ISOVUE-300) INJECTION 61%
INTRAVENOUS | Status: AC
  Administered 2018-03-27: 30 mL via ORAL
  Filled 2018-03-27: qty 30

## 2018-03-27 MED ORDER — FERROUS SULFATE 325 (65 FE) MG PO TABS
325.0000 mg | ORAL_TABLET | Freq: Every day | ORAL | Status: DC
  Administered 2018-03-27: 325 mg via ORAL
  Filled 2018-03-27: qty 1

## 2018-03-27 NOTE — Evaluation (Signed)
Physical Therapy Evaluation Patient Details Name: Joanna Gross MRN: [id number] DOB: [birth year] Today's Date: 03/27/2018   History of Present Illness  Joanna Gross is a 52 y.o. female with medical history significant for microcytic anemia, menorrhagia, admitted following allergic reaction to eucalyptus flavor and found to be anemic with hemoglobin 6.5.  Clinical Impression  Patient presents with decreased independence with mobility due to weakness, pain and limited activity tolerance.  She was able to make it up the stairs with assist and increased time so can access her home.  However, for her age and level of independence feel she would benefit from Joanna Gross for strengthening, endurance and balance training.  Will defer further skilled PT to home setting if she is able to get charity care.     Follow Up Recommendations Home health PT;Supervision - Intermittent    Equipment Recommendations  None recommended by PT    Recommendations for Other Services       Precautions / Restrictions Precautions Precautions: Fall Restrictions Weight Bearing Restrictions: No      Mobility  Bed Mobility Overal bed mobility: Modified Independent                Transfers Overall transfer level: Needs assistance   Transfers: Sit to/from Stand Sit to Stand: Supervision         General transfer comment: increased time and assist for safety due to imbalance  Ambulation/Gait Ambulation/Gait assistance: Supervision;Min assist Gait Distance (Feet): 80 Feet Assistive device: None;1 person hand held assist(and using wall rail in hallway) Gait Pattern/deviations: Step-through pattern;Step-to pattern;Shuffle;Trunk flexed;Decreased stride length     General Gait Details: demonstrates general weakness, on the way out the room no device, but occasionally reaching for rail or sink.  After stair negotiation back to room via HHA with slower pace and general fatigue  Stairs Stairs: Yes Stairs  assistance: Min assist Stair Management: One rail Right;Step to pattern;Sideways Number of Stairs: 10(one seated rest to don shoes) General stair comments: cues and demo for technique, assist for balance and safety; mother educated in how to assist on stairs and to put chair on landing  Wheelchair Mobility    Modified Rankin (Stroke Patients Only)       Balance Overall balance assessment: Needs assistance   Sitting balance-Leahy Scale: Good       Standing balance-Leahy Scale: Fair                               Pertinent Vitals/Pain Pain Assessment: 0-10 Pain Score: 8  Pain Location: R thigh  Pain Descriptors / Indicators: Throbbing;Tingling;Numbness Pain Intervention(s): Monitored during session;Repositioned    Home Living Family/patient expects to be discharged to:: Private residence Living Arrangements: Parent Available Help at Discharge: Family Type of Home: House(townhouse) Home Access: Level entry(or up back deck steps to second floor)     Home Layout: Multi-level(3 levels) Home Equipment: None      Prior Function Level of Independence: Independent         Comments: works from [place]        Extremity/Trunk Assessment   Upper Extremity Assessment Upper Extremity Assessment: Generalized weakness    Lower Extremity Assessment Lower Extremity Assessment: Generalized weakness       Communication   Communication: No difficulties  Cognition Arousal/Alertness: Awake/alert Behavior During Therapy: WFL for tasks assessed/performed Overall Cognitive Status: Within Functional Limits for tasks assessed  General Comments General comments (skin integrity, edema, etc.): Educated in possible causes on numbness/pain R Groin and to check with MD; mother in room and supportive    Exercises Other Exercises Other Exercises: demonstrated and educated on HEP with  written handout for sit to stand no UE support and supine bridging.   Assessment/Plan    PT Assessment All further PT needs can be met in the next venue of care  PT Problem List         PT Treatment Interventions      PT Goals (Current goals can be found in the Care Plan section)  Acute Rehab PT Goals Patient Stated Goal: to go home PT Goal Formulation: All assessment and education complete, DC therapy    Frequency     Barriers to discharge        Co-evaluation               AM-PAC PT "6 Clicks" Daily Activity  Outcome Measure Difficulty turning over in bed (including adjusting bedclothes, sheets and blankets)?: A Little Difficulty moving from lying on back to sitting on the side of the bed? : A Little Difficulty sitting down on and standing up from a chair with arms (e.g., wheelchair, bedside commode, etc,.)?: A Little Help needed moving to and from a bed to chair (including a wheelchair)?: A Little Help needed walking in hospital room?: A Little Help needed climbing 3-5 steps with a railing? : A Little 6 Click Score: 18    End of Session Equipment Utilized During Treatment: Gait belt Activity Tolerance: Patient tolerated treatment well Patient left: with call bell/phone within reach;in bed;with family/visitor present   PT Visit Diagnosis: Unsteadiness on feet (R26.81);Pain;Muscle weakness (generalized) (M62.81) Pain - Right/Left: Right Pain - part of body: Hip    Time: 4315-4008 PT Time Calculation (min) (ACUTE ONLY): 37 min   Charges:   PT Evaluation $PT Eval Moderate Complexity: 1 Mod PT Treatments $Gait Training: 8-22 mins        Magda Kiel, PT Acute Rehabilitation Services [phone] 03/27/2018   [name] 03/27/2018, 12:07 PM

## 2018-03-27 NOTE — Care Management Note (Signed)
Case Management Note  Received call at 4:58 for [facility] pt for pt with no ins.  CM was unable to reach [facility] due to the time of day.  Advised that [facility] would contact pt to evaluate her for charity need.  03/28/2018 Contacted inpt CM to advise of pt's needs from D/C the day before.  This CM is off work today.  Inpt CM advised she would follow up.  03/29/2018 Contacted Karen with [facility] who advised she spoke with inpt CM yesterday who stated the pt was walking up and down stairs with PT in the hospital and the single PT visit from charity would not be helpful.  No HH initiated.  Rishika Mccollom, Benjaman Lobe, RN 03/27/2018, 5:05 PM

## 2018-03-27 NOTE — Discharge Instructions (Signed)
Anemia Anemia is a condition in which you do not have enough red blood cells or hemoglobin. Hemoglobin is a substance in red blood cells that carries oxygen. When you do not have enough red blood cells or hemoglobin (are anemic), your body cannot get enough oxygen and your organs may not work properly. As a result, you may feel very tired or have other problems. What are the causes? Common causes of anemia include:  Excessive bleeding. Anemia can be caused by excessive bleeding inside or outside the body, including bleeding from the intestine or from periods in women.  Poor nutrition.  Long-lasting (chronic) kidney, thyroid, and liver disease.  Bone marrow disorders.  Cancer and treatments for cancer.  HIV (human immunodeficiency virus) and AIDS (acquired immunodeficiency syndrome).  Treatments for HIV and AIDS.  Spleen problems.  Blood disorders.  Infections, medicines, and autoimmune disorders that destroy red blood cells.  What are the signs or symptoms? Symptoms of this condition include:  Minor weakness.  Dizziness.  Headache.  Feeling heartbeats that are irregular or faster than normal (palpitations).  Shortness of breath, especially with exercise.  Paleness.  Cold sensitivity.  Indigestion.  Nausea.  Difficulty sleeping.  Difficulty concentrating.  Symptoms may occur suddenly or develop slowly. If your anemia is mild, you may not have symptoms. How is this diagnosed? This condition is diagnosed based on:  Blood tests.  Your medical history.  A physical exam.  Bone marrow biopsy.  Your health care provider may also check your stool (feces) for blood and may do additional testing to look for the cause of your bleeding. You may also have other tests, including:  Imaging tests, such as a CT scan or MRI.  Endoscopy.  Colonoscopy.  How is this treated? Treatment for this condition depends on the cause. If you continue to lose a lot of blood,  you may need to be treated at a hospital. Treatment may include:  Taking supplements of iron, vitamin T02, or folic acid.  Taking a hormone medicine (erythropoietin) that can help to stimulate red blood cell growth.  Having a blood transfusion. This may be needed if you lose a lot of blood.  Making changes to your diet.  Having surgery to remove your spleen.  Follow these instructions at home:  Take over-the-counter and prescription medicines only as told by your health care provider.  Take supplements only as told by your health care provider.  Follow any diet instructions that you were given.  Keep all follow-up visits as told by your health care provider. This is important. Contact a health care provider if:  You develop new bleeding anywhere in the body. Get help right away if:  You are very weak.  You are short of breath.  You have pain in your abdomen or chest.  You are dizzy or feel faint.  You have trouble concentrating.  You have bloody or black, tarry stools.  You vomit repeatedly or you vomit up blood. Summary  Anemia is a condition in which you do not have enough red blood cells or enough of a substance in your red blood cells that carries oxygen (hemoglobin).  Symptoms may occur suddenly or develop slowly.  If your anemia is mild, you may not have symptoms.  This condition is diagnosed with blood tests as well as a medical history and physical exam. Other tests may be needed.  Treatment for this condition depends on the cause of the anemia. This information is not intended to replace advice  given to you by your health care provider. Make sure you discuss any questions you have with your health care provider. °Document Released: 07/28/2004 Document Revised: 07/22/2016 Document Reviewed: 07/22/2016 °Elsevier Interactive Patient Education © 2018 [employer] ° °

## 2018-03-27 NOTE — Discharge Summary (Signed)
Discharge Summary  Joanna Gross UXL:[id number] DOB: [birth year]  PCP: Hayden Rasmussen, MD  Admit date: 03/26/2018 Discharge date: 03/27/2018  Time spent: 25 minutes  Recommendations for Outpatient Follow-up:  1. Follow-up with your OB/GYN 2. Take your medications as prescribed 3. Stay hydrated due to recent contrast  Discharge Diagnoses:  Active Hospital Problems   Diagnosis Date Noted  . Symptomatic anemia 03/26/2018    Resolved Hospital Problems  No resolved problems to display.    Discharge Condition: Stable  Diet recommendation: Resume previous diet  Vitals:   03/27/18 0557 03/27/18 1415  BP: 108/71 117/62  Pulse: (!) 59 65  Resp: 20 14  Temp: 98.2 F (36.8 C) 98.8 F (37.1 C)  SpO2: 99% 98%    History of present illness:  Joanna Gross is a 52 y.o. female with medical history significant for microcytic anemia, menorrhagia, who presented to [facility] ED from urgent care where she went to follow up on treatment for allergic reaction to [facility] flavor that took place last week. Was told at Urgent Care that she is anemic, needed blood transfusion and to report to the ED.  She endorses regular and heavy menses lasting roughly 6 days per month.  Denies melena or hematochezia.  She completed 3 day course of prednisone.  Denies any abdominal pain or nausea.  Denies overt bleeding.  Admits to dyspnea on ambulation and persitent fatigue for the last couple of weeks.  No chest pain or palpitations.   Upon presentation to the ED, the patient's vital signs are stable.  Hemoglobin 6.5 and MCV 64.  Iron studies ordered by ED physician.  [facility] asked to admit for symptomatic anemia.  Transfuse 2 unit PRBCs on 03/26/2018.  Started on ferrous sulfate supplement 325 mg daily.  03/27/2018: Patient seen and examined her bedside.  Earlier today had complaints of lateral right thigh pain.  X-ray unrevealing.  Also complains of severe abdominal cramp.  CT abdomen pelvis with contrast revealed  large fibroids which are smaller when compared to CT abdomen pelvis with contrast done on 2015.  On the day of discharge the patient was hemodynamically stable and she was able to tolerate a diet.  She will need to follow-up with her OB/GYN posthospitalization and take her medications as prescribed.   Hospital Course:  Active Problems:   Symptomatic anemia  Symptomatic microcytic anemia/iron deficiency anemia Post 2 unit PRBC transfused on 03/23/2018 Hemoglobin this morning 8.9 On presentation hemoGlobin 6.5 with MCV 64 Baseline hemoglobin 9 Suspect 2/2 to menorrhagia in the setting of large fibroids Continue ferrous sulfate 325 mg daily Follow-up with your OB/GYN posthospitalization  Hoarseness suspect secondary to recent allergic reaction to eucalyptus flavor Completed 3 days of oral steroid Denies any significant discomfort No erythema, exudate, or swelling No odynophagia  Large fibroids Smaller when compared to CT abdomen pelvis with contrast done in 2019  Abdominal cramps, resolved CT abdomen pelvis with contrast done on 03/27/2018 and revealing Large fibroids which is small in size compared to prior  Right lateral leg pain suspect musculoskeletal X-ray femur unrevealing  Obesity BMI 35 Recommend weight loss outpatient with regular physical activity and healthy dieting    Procedures:  None  Consultations:  None  Discharge Exam: BP 117/62   Pulse 65   Temp 98.8 F (37.1 C) (Oral)   Resp 14   Ht 5' 5.5" (1.664 m)   Wt 97.1 kg   LMP 03/14/2018   SpO2 98%   BMI 35.07 kg/m  .  General: 52 y.o. year-old female well developed well nourished in no acute distress.  Alert and oriented x3. . Cardiovascular: Regular rate and rhythm with no rubs or gallops.  No thyromegaly or JVD noted.   Marland Kitchen Respiratory: Clear to auscultation with no wheezes or rales. Good inspiratory effort. . Abdomen: Soft nontender nondistended with normal bowel sounds x4  quadrants. . Musculoskeletal: No lower extremity edema. 2/4 pulses in all 4 extremities. . Skin: No ulcerative lesions noted or rashes, . Psychiatry: Mood is appropriate for condition and setting  Discharge Instructions You were cared for by a hospitalist during your hospital stay. If you have any questions about your discharge medications or the care you received while you were in the hospital after you are discharged, you can call the unit and asked to speak with the hospitalist on call if the hospitalist that took care of you is not available. Once you are discharged, your primary care physician will handle any further medical issues. Please note that NO REFILLS for any discharge medications will be authorized once you are discharged, as it is imperative that you return to your primary care physician (or establish a relationship with a primary care physician if you do not have one) for your aftercare needs so that they can reassess your need for medications and monitor your lab values.   Allergies as of 03/27/2018      Reactions   Amoxicillin Anaphylaxis   Shortness of breath, boils Has patient had a PCN reaction causing immediate rash, facial/tongue/throat swelling, SOB or lightheadedness with hypotension: Yes Has patient had a PCN reaction causing severe rash involving mucus membranes or skin necrosis: Yes Has patient had a PCN reaction that required hospitalization: Yes Has patient had a PCN reaction occurring within the last 10 years: No If all of the above answers are "NO", then may proceed with Cephalosporin use.   Penicillins Anaphylaxis   Shortness of breath, boils  Has patient had a PCN reaction causing immediate rash, facial/tongue/throat swelling, SOB or lightheadedness with hypotension: Yes Has patient had a PCN reaction causing severe rash involving mucus membranes or skin necrosis: Yes Has patient had a PCN reaction that required hospitalization: Yes Has patient had a PCN  reaction occurring within the last 10 years: No If all of the above answers are "NO", then may proceed with Cephalosporin use.   Bee Venom Swelling   Erythromycin    Shortness of breath, boils   Eucalyptus Flavor [flavoring Agent]    Hoarse voice, lip burning      Medication List    STOP taking these medications   LASIX 20 MG tablet Generic drug:  furosemide     TAKE these medications   ferrous sulfate 325 (65 FE) MG tablet Take 1 tablet (325 mg total) by mouth daily with breakfast. Start taking on:  03/28/2018      Allergies  Allergen Reactions  . Amoxicillin Anaphylaxis    Shortness of breath, boils Has patient had a PCN reaction causing immediate rash, facial/tongue/throat swelling, SOB or lightheadedness with hypotension: Yes Has patient had a PCN reaction causing severe rash involving mucus membranes or skin necrosis: Yes Has patient had a PCN reaction that required hospitalization: Yes Has patient had a PCN reaction occurring within the last 10 years: No If all of the above answers are "NO", then may proceed with Cephalosporin use.   Marland Kitchen Penicillins Anaphylaxis    Shortness of breath, boils  Has patient had a PCN reaction causing immediate rash, facial/tongue/throat  swelling, SOB or lightheadedness with hypotension: Yes Has patient had a PCN reaction causing severe rash involving mucus membranes or skin necrosis: Yes Has patient had a PCN reaction that required hospitalization: Yes Has patient had a PCN reaction occurring within the last 10 years: No If all of the above answers are "NO", then may proceed with Cephalosporin use.   . Bee Venom Swelling  . Erythromycin     Shortness of breath, boils  . Eucalyptus Flavor [Flavoring Agent]     Hoarse voice, lip burning   Follow-up Information    Hayden Rasmussen, MD. Call in 1 day(s).   Specialty:  Family Medicine Why:  Please call for a post hospital follow-up appointment. Contact information: [address] [place] [address] [phone]            The results of significant diagnostics from this hospitalization (including imaging, microbiology, ancillary and laboratory) are listed below for reference.    Significant Diagnostic Studies: Ct Abdomen Pelvis W Contrast  Result Date: 03/27/2018 CLINICAL DATA:  Abdominal pain and anemia. EXAM: CT ABDOMEN AND PELVIS WITH CONTRAST TECHNIQUE: Multidetector CT imaging of the abdomen and pelvis was performed using the standard protocol following bolus administration of intravenous contrast. CONTRAST:  19mL OMNIPAQUE IOHEXOL 300 MG/ML  SOLN COMPARISON:  CT abdomen pelvis 08/06/2013 FINDINGS: LOWER CHEST: There is no basilar pleural or apical pericardial effusion. HEPATOBILIARY: The hepatic contours and density are normal. There is no intra- or extrahepatic biliary dilatation. There is a small stone at the gallbladder neck. The gallbladder is otherwise normal in appearance. PANCREAS: The pancreatic parenchymal contours are normal and there is no ductal dilatation. There is no peripancreatic fluid collection. SPLEEN: Normal. ADRENALS/URINARY TRACT: --Adrenal glands: Normal. --Right kidney/ureter: No hydronephrosis, nephroureterolithiasis, perinephric stranding or solid renal mass. --Left kidney/ureter: No hydronephrosis, nephroureterolithiasis, perinephric stranding or solid renal mass. --Urinary bladder: Normal for degree of distention STOMACH/BOWEL: --Stomach/Duodenum: There is no hiatal hernia or other gastric abnormality. The duodenal course and caliber are normal. --Small bowel: No dilatation or inflammation. --Colon: No focal abnormality. --Appendix: Not visualized. No right lower quadrant inflammation or free fluid. VASCULAR/LYMPHATIC: Normal course and caliber of the major abdominal vessels. No abdominal or pelvic lymphadenopathy. REPRODUCTIVE: There are numerous large uterine fibroids, which are difficult to assess separately.  Altogether, the confluent fibroid mass measures 11.1 x 8.9 x 9.8 cm, previously 15.9 x 9.7 by 16.0 cm. There are bilateral ovarian cysts. MUSCULOSKELETAL. No bony spinal canal stenosis or focal osseous abnormality. OTHER: None. IMPRESSION: 1. Multiple large uterine fibroids, altogether measuring 11.1 x 8.9 x 9.8 cm, previously 15.9 x 9.7 x 16.0 cm. Fibroids may be a source of uterine bleeding, though no acute hemorrhage is demonstrated on this examination. 2. Cholelithiasis without evidence of acute cholecystitis. These results were discussed by telephone at the time of interpretation on 03/27/2018 at 4:51 pm to Dr. Irene Pap , who verbally acknowledged these results. Electronically Signed   By: Ulyses Jarred M.D.   On: 03/27/2018 16:51   Dg Femur Port, 1v Right  Result Date: 03/27/2018 CLINICAL DATA:  Pain along the right femur laterally EXAM: RIGHT FEMUR PORTABLE 1 VIEW COMPARISON:  None. FINDINGS: The right femur is intact. Only mild degenerative change of the right hip joint is seen. However there is degenerative joint disease involving the right knee primarily involving the medial compartment where there is considerable loss of joint space and sclerosis with spurring present. Also small loose bodies cannot be excluded within  the right knee joint space. IMPRESSION: 1. Negative right femur. 2. Degenerative change involves the right knee joint space particularly medially. Electronically Signed   By: Ivar Drape M.D.   On: 03/27/2018 12:21    Microbiology: No results found for this or any previous visit (from the past 240 hour(s)).   Labs: Basic Metabolic Panel: Recent Labs  Lab 03/26/18 1302 03/26/18 1531  NA 135 137  K 3.9 4.2  CL 101 103  CO2 24 26  GLUCOSE 98 97  BUN 8 9  CREATININE 0.63 0.74  CALCIUM 9.5 9.6   Liver Function Tests: Recent Labs  Lab 03/26/18 1302 03/26/18 1531  AST 23 37  ALT 16 18  ALKPHOS 62 56  BILITOT 0.3 0.8  PROT 8.0 8.1  ALBUMIN 4.1 3.9   No results  for input(s): LIPASE, AMYLASE in the last 168 hours. No results for input(s): AMMONIA in the last 168 hours. CBC: Recent Labs  Lab 03/26/18 1302 03/26/18 1538 03/27/18 0543  WBC 4.0 4.4 5.5  NEUTROABS  --  2.4  --   HGB 6.5* 6.5* 8.9*  HCT 25.5* 24.9* 31.0*  MCV 65.6* 64.8* 67.8*  PLT 290 315 265   Cardiac Enzymes: No results for input(s): CKTOTAL, CKMB, CKMBINDEX, TROPONINI in the last 168 hours. BNP: BNP (last 3 results) No results for input(s): BNP in the last 8760 hours.  ProBNP (last 3 results) No results for input(s): PROBNP in the last 8760 hours.  CBG: No results for input(s): GLUCAP in the last 168 hours.     Signed:  Kayleen Memos, MD [facility] 03/27/2018, 5:03 PM

## 2018-03-28 LAB — BPAM RBC
Blood Product Expiration Date: 201910172359
Blood Product Expiration Date: 201910172359
ISSUE DATE / TIME: 201909240017
ISSUE DATE / TIME: 201909232039
UNIT TYPE AND RH: 6200
UNIT TYPE AND RH: 6200

## 2018-03-28 LAB — TYPE AND SCREEN
ABO/RH(D): A POS
Antibody Screen: NEGATIVE
Unit division: 0
Unit division: 0

## 2018-03-28 NOTE — ED Notes (Signed)
Called pt to inform her that Dr. Joseph Art would not be here today as she requested to see him for an ED follow up.  Pt states she still feels like she has something in her stomach even though they did scans in the ED and found nothing.  She did not have any other complaints other than the continued Hoarseness.  It was recommended that the pt return to the ED if the knot in her stomach was too bothersome, but to also set up primary care at [facility] so she can have extended follow up for her issues.  Pt stated understanding and asked that I cancel her appointment for today.

## 2018-04-16 ENCOUNTER — Other Ambulatory Visit: Payer: Self-pay | Admitting: Family Medicine

## 2018-04-16 DIAGNOSIS — D259 Leiomyoma of uterus, unspecified: Secondary | ICD-10-CM

## 2018-04-26 ENCOUNTER — Ambulatory Visit
Admission: RE | Admit: 2018-04-26 | Discharge: 2018-04-26 | Disposition: A | Discharge status: Home / Self Care | Payer: Self-pay | Source: Ambulatory Visit | Attending: Family Medicine | Admitting: Family Medicine

## 2018-04-26 DIAGNOSIS — D259 Leiomyoma of uterus, unspecified: Secondary | ICD-10-CM

## 2018-07-16 ENCOUNTER — Other Ambulatory Visit: Payer: Self-pay | Admitting: Family Medicine

## 2018-07-16 ENCOUNTER — Ambulatory Visit
Admission: RE | Admit: 2018-07-16 | Discharge: 2018-07-16 | Disposition: A | Discharge status: Home / Self Care | Payer: PRIVATE HEALTH INSURANCE | Source: Ambulatory Visit | Attending: Family Medicine | Admitting: Family Medicine

## 2018-07-16 DIAGNOSIS — M5416 Radiculopathy, lumbar region: Secondary | ICD-10-CM

## 2018-07-17 ENCOUNTER — Other Ambulatory Visit: Payer: Self-pay | Admitting: Family Medicine

## 2018-07-17 DIAGNOSIS — D219 Benign neoplasm of connective and other soft tissue, unspecified: Secondary | ICD-10-CM

## 2018-08-01 ENCOUNTER — Ambulatory Visit
Admission: RE | Admit: 2018-08-01 | Discharge: 2018-08-01 | Disposition: A | Discharge status: Home / Self Care | Payer: PRIVATE HEALTH INSURANCE | Source: Ambulatory Visit | Attending: Family Medicine | Admitting: Family Medicine

## 2018-08-01 DIAGNOSIS — D219 Benign neoplasm of connective and other soft tissue, unspecified: Secondary | ICD-10-CM

## 2018-08-08 ENCOUNTER — Ambulatory Visit (INDEPENDENT_AMBULATORY_CARE_PROVIDER_SITE_OTHER): Payer: PRIVATE HEALTH INSURANCE | Admitting: Obstetrics & Gynecology

## 2018-08-08 ENCOUNTER — Telehealth: Payer: Self-pay | Admitting: *Deleted

## 2018-08-08 ENCOUNTER — Encounter: Payer: Self-pay | Admitting: Obstetrics & Gynecology

## 2018-08-08 VITALS — BP 122/70 | Ht 65.0 in | Wt 243.0 lb

## 2018-08-08 DIAGNOSIS — D500 Iron deficiency anemia secondary to blood loss (chronic): Secondary | ICD-10-CM

## 2018-08-08 DIAGNOSIS — M79604 Pain in right leg: Secondary | ICD-10-CM

## 2018-08-08 DIAGNOSIS — Z01419 Encounter for gynecological examination (general) (routine) without abnormal findings: Secondary | ICD-10-CM

## 2018-08-08 DIAGNOSIS — M545 Low back pain, unspecified: Secondary | ICD-10-CM

## 2018-08-08 DIAGNOSIS — M5441 Lumbago with sciatica, right side: Secondary | ICD-10-CM

## 2018-08-08 DIAGNOSIS — Z3049 Encounter for surveillance of other contraceptives: Secondary | ICD-10-CM

## 2018-08-08 DIAGNOSIS — D219 Benign neoplasm of connective and other soft tissue, unspecified: Secondary | ICD-10-CM

## 2018-08-08 DIAGNOSIS — Z6841 Body Mass Index (BMI) 40.0 and over, adult: Secondary | ICD-10-CM

## 2018-08-08 DIAGNOSIS — Z01411 Encounter for gynecological examination (general) (routine) with abnormal findings: Secondary | ICD-10-CM | POA: Diagnosis not present

## 2018-08-08 DIAGNOSIS — Z1151 Encounter for screening for human papillomavirus (HPV): Secondary | ICD-10-CM | POA: Diagnosis not present

## 2018-08-08 DIAGNOSIS — G8929 Other chronic pain: Secondary | ICD-10-CM

## 2018-08-08 NOTE — Patient Instructions (Signed)
1. Encounter for routine gynecological examination with Papanicolaou smear of cervix Gynecologic exam with an enlarged uterus due to fibroids.  Pap with high-risk HPV done.  Breast exam normal.  Will schedule screening mammogram now.  Screening colonoscopy scheduled.  Health labs with family physician.  2. Encounter for surveillance of condom contraception  3. Fibroids Large fibroids with heavy periods probably responsible for severe anemia which required a blood transfusion in September 2019.  Recent hemoglobin at 10.9.  Given that patient has both menorrhagia with secondary anemia and large fibroids probably causing pain and possibly nerve compression with right leg lateral pain, surgical approach with hysterectomy versus DepoLupron treatment discussed and recommended.  Patient would like to try Depo-Lupron before resorting to hysterectomy.  Depo-Lupron usage, risks and benefits reviewed with patient.  Will add information about Depo-Lupron to summary.  Follow-up sonohysterogram to rule out intrauterine lesion. - [place] Sonohysterogram; Future  4. Iron deficiency anemia due to chronic blood loss Continue with iron rich nutrition and iron supplement.  Will start Depo-Lupron treatment as soon as possible.  Follow-up sonohysterogram. - [place] Sonohysterogram; Future  5. Class 3 severe obesity due to excess calories without serious comorbidity with body mass index (BMI) of 40.0 to 44.9 in adult [facility]) Low calorie/carb diet such as [employer] recommended.  Aerobic physical activities 5 times a week and weightlifting every 2 days recommended.  6. 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  Refer to Orthopedist.  Other orders - Prenatal Vit-Fe Fumarate-FA (PRENATAL MULTIVITAMIN) TABS tablet; Take 1 tablet by mouth daily at 12 noon. - naproxen sodium (ALEVE) 220 MG  tablet; Take 220 mg by mouth. - famotidine (PEPCID) 20 MG tablet; Take 20 mg by mouth 2 (two) times daily.  Joanna Gross, it was a pleasure meeting you today!  I will inform you of your results as soon as they are available.  Leuprolide depot injection What is this medicine? LEUPROLIDE (loo PROE lide) is a man-made protein that acts like a natural hormone in the body. It decreases testosterone in men and decreases estrogen in women. In men, this medicine is used to treat advanced prostate cancer. In women, some forms of this medicine may be used to treat endometriosis, uterine fibroids, or other female hormone-related problems. This medicine may be used for other purposes; ask your health care provider or pharmacist if you have questions. COMMON BRAND NAME(S): Eligard, Lupron Depot, Lupron Depot-Ped, Viadur What should I tell my health care provider before I take this medicine? They need to know if you have any of these conditions: -diabetes -heart disease or previous heart attack -high blood pressure -high cholesterol -mental illness -osteoporosis -pain or difficulty passing urine -seizures -spinal cord metastasis -stroke -suicidal thoughts, plans, or attempt; a previous suicide attempt by you or a family member -tobacco smoker -unusual vaginal bleeding (women) -an unusual or allergic reaction to leuprolide, benzyl alcohol, other medicines, foods, dyes, or preservatives -pregnant or trying to get pregnant -breast-feeding How should I use this medicine? This medicine is for injection into a muscle or for injection under the skin. It is given by a health care professional in a hospital or clinic setting. The specific product will determine how it will be given to you. Make sure you understand which product you receive and how often you will receive it. Talk to your pediatrician regarding the use of this medicine in children. Special  care may be needed. Overdosage: If you think you have  taken too much of this medicine contact a poison control center or emergency room at once. NOTE: This medicine is only for you. Do not share this medicine with others. What if I miss a dose? It is important not to miss a dose. Call your doctor or health care professional if you are unable to keep an appointment. Depot injections: Depot injections are given either once-monthly, every 12 weeks, every 16 weeks, or every 24 weeks depending on the product you are prescribed. The product you are prescribed will be based on if you are female or female, and your condition. Make sure you understand your product and dosing. What may interact with this medicine? Do not take this medicine with any of the following medications: -chasteberry This medicine may also interact with the following medications: -herbal or dietary supplements, like black cohosh or DHEA -female hormones, like estrogens or progestins and birth control pills, patches, rings, or injections -female hormones, like testosterone This list may not describe all possible interactions. Give your health care provider a list of all the medicines, herbs, non-prescription drugs, or dietary supplements you use. Also tell them if you smoke, drink alcohol, or use illegal drugs. Some items may interact with your medicine. What should I watch for while using this medicine? Visit your doctor or health care professional for regular checks on your progress. During the first weeks of treatment, your symptoms may get worse, but then will improve as you continue your treatment. You may get hot flashes, increased bone pain, increased difficulty passing urine, or an aggravation of nerve symptoms. Discuss these effects with your doctor or health care professional, some of them may improve with continued use of this medicine. Female patients may experience a menstrual cycle or spotting during the first months of therapy with this medicine. If this continues, contact your  doctor or health care professional. What side effects may I notice from receiving this medicine? Side effects that you should report to your doctor or health care professional as soon as possible: -allergic reactions like skin rash, itching or hives, swelling of the face, lips, or tongue -breathing problems -chest pain -depression or memory disorders -pain in your legs or groin -pain at site where injected or implanted -seizures -severe headache -swelling of the feet and legs -suicidal thoughts or other mood changes -visual changes -vomiting Side effects that usually do not require medical attention (report to your doctor or health care professional if they continue or are bothersome): -breast swelling or tenderness -decrease in sex drive or performance -diarrhea -hot flashes -loss of appetite -muscle, joint, or bone pains -nausea -redness or irritation at site where injected or implanted -skin problems or acne This list may not describe all possible side effects. Call your doctor for medical advice about side effects. You may report side effects to FDA at 1-800-FDA-1088. Where should I keep my medicine? This drug is given in a hospital or clinic and will not be stored at home. NOTE: This sheet is a summary. It may not cover all possible information. If you have questions about this medicine, talk to your doctor, pharmacist, or health care provider.  2019 Elsevier/Gold Standard (2015-12-03 09:45:53)

## 2018-08-08 NOTE — Telephone Encounter (Signed)
Referral placed in epic/proficent at [facility] they will call patient to schedule.

## 2018-08-08 NOTE — Telephone Encounter (Signed)
-----   Message from Princess Bruins, MD sent at 08/08/2018  2:12 PM EST ----- Regarding: Refer to Orthopedist Lower back pain with Rt leg lateral pain.

## 2018-08-08 NOTE — Progress Notes (Signed)
Joanna Gross 11-03-1965 [id number]   History:    53 y.o. G1P0A1 Divorced.  Currently not in a relationship.  RP:  New patient presenting for annual gyn exam and Uterine Fibroids with pain and secondary anemia  HPI:  Menstrual periods every month lasting 7 days.  Flow has been heavy all her life, but not worse recently.  Hb found to be 6.5 in 03/2018, Had a Blood Transfusion at that time.  Hb 10.9 on 07/17/2018.  No pelvic pain, but c/o lower back pain with Rt lateral leg pain x 2 weeks.  Breasts normal.  BMI 40.44, concerned about her recent weight gain.  TSH normal 07/17/2018.  Health labs with Dr Darron Doom.  Past medical history,surgical history, family history and social history were all reviewed and documented in the EPIC chart.  Gynecologic History Patient's last menstrual period was 08/02/2018. Contraception: none Last Pap: 2018. Results were: normal Last mammogram: 2018. Results were: Normal per patient.  [facility] Bone Density: Never Colonoscopy: Scheduled to do it soon  Obstetric History OB History  Gravida Para Term Preterm AB Living  1 0     1 0  SAB TAB Ectopic Multiple Live Births               # Outcome Date GA Lbr Len/2nd Weight Sex Delivery Anes PTL Lv  1 AB              ROS: A ROS was performed and pertinent positives and negatives are included in the history.  GENERAL: No fevers or chills. HEENT: No change in vision, no earache, sore throat or sinus congestion. NECK: No pain or stiffness. CARDIOVASCULAR: No chest pain or pressure. No palpitations. PULMONARY: No shortness of breath, cough or wheeze. GASTROINTESTINAL: No abdominal pain, nausea, vomiting or diarrhea, melena or bright red blood per rectum. GENITOURINARY: No urinary frequency, urgency, hesitancy or dysuria. MUSCULOSKELETAL: No joint or muscle pain, no back pain, no recent trauma. DERMATOLOGIC: No rash, no itching, no lesions. ENDOCRINE: No polyuria, polydipsia, no heat or  cold intolerance. No recent change in weight. HEMATOLOGICAL: No anemia or easy bruising or bleeding. NEUROLOGIC: No headache, seizures, numbness, tingling or weakness. PSYCHIATRIC: No depression, no loss of interest in normal activity or change in sleep pattern.     Exam:   BP 122/70   Ht 5\' 5"  (1.651 m)   Wt 243 lb (110.2 kg)   LMP 08/02/2018   BMI 40.44 kg/m   Body mass index is 40.44 kg/m.  General appearance : Well developed well nourished female. No acute distress HEENT: Eyes: no retinal hemorrhage or exudates,  Neck supple, trachea midline, no carotid bruits, no thyroidmegaly Lungs: Clear to auscultation, no rhonchi or wheezes, or rib retractions  Heart: Regular rate and rhythm, no murmurs or gallops Breast:Examined in sitting and supine position were symmetrical in appearance, no palpable masses or tenderness,  no skin retraction, no nipple inversion, no nipple discharge, no skin discoloration, no axillary or supraclavicular lymphadenopathy Abdomen: no palpable masses or tenderness, no rebound or guarding Extremities: no edema or skin discoloration or tenderness  Pelvic: Vulva: Normal             Vagina: No gross lesions or discharge  Cervix: No gross lesions or discharge.  Pap/HPV HR done.  Uterus  AV, increased in size about 16 cm, nodular, more nodularity towards the right pelvis, non-tender and mobile  Adnexa  Without masses or tenderness  Anus: Normal  Pelvic US 08/01/2018: Uterus: Measurements: 15.3 x 8.7 x 2.1 cm = volume: 706.9 mL. 4.6 x 3.6 x 3.7 cm intramural fibroid present at the anterior uterine fundus. Additional submucosal fibroid at the left anterior mid uterine body measures 3.2 x 3.2 x 2.7 cm. Borders of this fibroid somewhat difficult to delineate.  Endometrium: Thickness: 11.2 mm. No definite focal abnormality visualized. Endometrial complex somewhat distorted by adjacent fibroid. Right ovary: Not visualized.  No adnexal mass. Left ovary: Not visualized.  No  adnexal mass. Trace free physiologic fluid within the pelvis.  07/17/2018 CBC:  HB 10.9                  TSH: 3.81 Normal   Assessment/Plan:  53 y.o. female for annual exam   1. Encounter for routine gynecological examination with Papanicolaou smear of cervix Gynecologic exam with an enlarged uterus due to fibroids.  Pap with high-risk HPV done.  Breast exam normal.  Will schedule screening mammogram now.  Screening colonoscopy scheduled.  Health labs with family physician.  2. Encounter for surveillance of condom contraception  3. Fibroids Large fibroids with heavy periods probably responsible for severe anemia which required a blood transfusion in September 2019.  Recent hemoglobin at 10.9.  Given that patient has both menorrhagia with secondary anemia and large fibroids probably causing pain and possibly nerve compression with right leg lateral pain, surgical approach with hysterectomy versus DepoLupron treatment discussed and recommended.  Patient would like to try Depo-Lupron before resorting to hysterectomy.  Depo-Lupron usage, risks and benefits reviewed with patient.  Will add information about Depo-Lupron to summary.  Follow-up sonohysterogram to rule out intrauterine lesion. - [place] Sonohysterogram; Future  4. Iron deficiency anemia due to chronic blood loss Continue with iron rich nutrition and iron supplement.  Will start Depo-Lupron treatment as soon as possible.  Follow-up sonohysterogram. - [place] Sonohysterogram; Future  5. Class 3 severe obesity due to excess calories without serious comorbidity with body mass index (BMI) of 40.0 to 44.9 in adult [facility]) Low calorie/carb diet such as [employer] recommended.  Aerobic physical activities 5 times a week and weightlifting every 2 days recommended.  6. 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   Refer to Orthopedist.  Other orders - Prenatal Vit-Fe Fumarate-FA (PRENATAL MULTIVITAMIN) TABS tablet; Take 1 tablet by mouth daily at 12 noon. - naproxen sodium (ALEVE) 220 MG tablet; Take 220 mg by mouth. - famotidine (PEPCID) 20 MG tablet; Take 20 mg by mouth 2 (two) times daily.  Counseling on above issues and coordination of care more than 50% for 20 minutes.  Princess Bruins MD, 1:43 PM 08/08/2018

## 2018-08-09 LAB — PAP, TP IMAGING W/ HPV RNA, RFLX HPV TYPE 16,18/45: HPV DNA High Risk: NOT DETECTED

## 2018-08-10 ENCOUNTER — Telehealth: Payer: Self-pay

## 2018-08-10 NOTE — Telephone Encounter (Signed)
Please tell her that I don't recommend that because I had 2 patients who had severe inflammation with adhesions which resulted in bowel obstruction after Arterial Embolization.

## 2018-08-10 NOTE — Telephone Encounter (Signed)
I called patient to let her know that her Lupron order had been faxed to [facility]. They will research ins benefits and locate her specialty pharmacy for her ins and they will call her to inform her.  She said that she was reading about side affects of Lupron last night and not sure this is what she wants to do.  She asked if she might be a candidiate for Uterine Artery Embolization?

## 2018-08-13 ENCOUNTER — Ambulatory Visit (INDEPENDENT_AMBULATORY_CARE_PROVIDER_SITE_OTHER): Payer: PRIVATE HEALTH INSURANCE | Admitting: Family Medicine

## 2018-08-13 ENCOUNTER — Encounter (INDEPENDENT_AMBULATORY_CARE_PROVIDER_SITE_OTHER): Payer: Self-pay | Admitting: Family Medicine

## 2018-08-13 VITALS — BP 115/70 | HR 76 | Ht 60.5 in | Wt 248.0 lb

## 2018-08-13 DIAGNOSIS — G8929 Other chronic pain: Secondary | ICD-10-CM

## 2018-08-13 DIAGNOSIS — M5441 Lumbago with sciatica, right side: Secondary | ICD-10-CM

## 2018-08-13 MED ORDER — LIDOCAINE 5 % EX PTCH: 1.0000 | MEDICATED_PATCH | CUTANEOUS | 6 refills | Status: DC
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
## 2018-08-13 NOTE — Telephone Encounter (Signed)
Spoke with patient and informed her. °

## 2018-08-13 NOTE — Telephone Encounter (Signed)
Patient scheduled on 08/13/18 with Dr. Junius Roads

## 2018-08-16 ENCOUNTER — Telehealth: Payer: Self-pay

## 2018-08-16 ENCOUNTER — Telehealth: Payer: Self-pay | Admitting: *Deleted

## 2018-08-16 NOTE — Telephone Encounter (Signed)
Dante from pharmacy called regarding prior authorization form for Lupron. Dr. Marguerita Merles had ordered two injections. One now and one in 3 mos.  He said they can only authorize one visit at a time.  I will have to reapply for PA for the next injection and cannot do that before 30 days prior to next appt. Can use same form I submitted today.

## 2018-08-16 NOTE — Telephone Encounter (Signed)
Patient called with several concerns:  1. Noticed vaginal spotting from OV on 08/08/18 mainly when wiping asked if this is normal?   2. She asked if she proceeds with Depo-Lupron after completing the dosage, and the fibroids do indeed shrink, any chance they can go back bigger?  3.also wanted to know if myomectomy would be an option verses hysterectomy? History of myomectomy in past.    Please advise

## 2018-08-17 NOTE — Telephone Encounter (Signed)
Patient mailbox full, not able to leave a message

## 2018-08-17 NOTE — Telephone Encounter (Signed)
Yes to all 3 questions.  Needs an appointment if wants to discuss further.

## 2018-08-17 NOTE — Telephone Encounter (Signed)
Patient informed with below note. 

## 2018-08-18 ENCOUNTER — Other Ambulatory Visit (INDEPENDENT_AMBULATORY_CARE_PROVIDER_SITE_OTHER): Payer: Self-pay | Admitting: Family Medicine

## 2018-08-18 DIAGNOSIS — G8929 Other chronic pain: Secondary | ICD-10-CM

## 2018-08-18 DIAGNOSIS — M5441 Lumbago with sciatica, right side: Principal | ICD-10-CM

## 2018-08-28 ENCOUNTER — Other Ambulatory Visit: Payer: PRIVATE HEALTH INSURANCE

## 2018-08-29 ENCOUNTER — Telehealth: Payer: Self-pay

## 2018-08-29 NOTE — Telephone Encounter (Signed)
Pharmacy called yesterday regarding "status of PA".  I told her I had received PA for this medication and offered the PA #.  She told me that this PA was not on correct form. I explained it was the form the company faxed me after Doy Mince originally faxed me the wrong form. I have completed and submitted two PA forms now. She told me I could call [phone] and handle it over the phone. When I called there they explained that they do not handle PA over phone but will fax a form. Form was faxed. I completed it, Dr. Marguerita Merles signed and it was faxed back today. I have fax confirmation.

## 2018-09-03 ENCOUNTER — Encounter: Payer: Self-pay | Admitting: Anesthesiology

## 2018-09-05 ENCOUNTER — Other Ambulatory Visit: Payer: Self-pay | Admitting: Obstetrics & Gynecology

## 2018-09-05 DIAGNOSIS — D251 Intramural leiomyoma of uterus: Secondary | ICD-10-CM

## 2018-09-05 DIAGNOSIS — N852 Hypertrophy of uterus: Secondary | ICD-10-CM

## 2018-09-10 ENCOUNTER — Ambulatory Visit: Payer: PRIVATE HEALTH INSURANCE | Admitting: Obstetrics & Gynecology

## 2018-09-10 ENCOUNTER — Other Ambulatory Visit: Payer: PRIVATE HEALTH INSURANCE

## 2018-09-10 ENCOUNTER — Telehealth: Payer: Self-pay | Admitting: *Deleted

## 2018-09-10 NOTE — Telephone Encounter (Signed)
Patient has fibroids and was schedule to go on trip to [place] but has canceled, needs [facility]GM. She asked if you would be willing to fill out a form for her to get her money reimbursed (she did not buy trip insurance at the time) due to fibroids/procedure? Please advise

## 2018-09-10 NOTE — Telephone Encounter (Signed)
Patient informed, will fax sheet Attention:Blanca to have Dr. Dellis Filbert fill out.

## 2018-09-10 NOTE — Telephone Encounter (Signed)
I can say that she needs a Sonohysterogram for Fibroids.

## 2018-09-12 ENCOUNTER — Other Ambulatory Visit: Payer: PRIVATE HEALTH INSURANCE

## 2018-09-12 ENCOUNTER — Ambulatory Visit: Payer: PRIVATE HEALTH INSURANCE | Admitting: Obstetrics & Gynecology

## 2019-03-03 IMAGING — DX DG FEMUR 1V PORT*R*
2 series · 2 of 2 positions shown · non-contrast
Comparison: None.

CLINICAL DATA: Pain along the right femur laterally

EXAM:
RIGHT FEMUR PORTABLE 1 VIEW

[femur ap (1 of 2)]
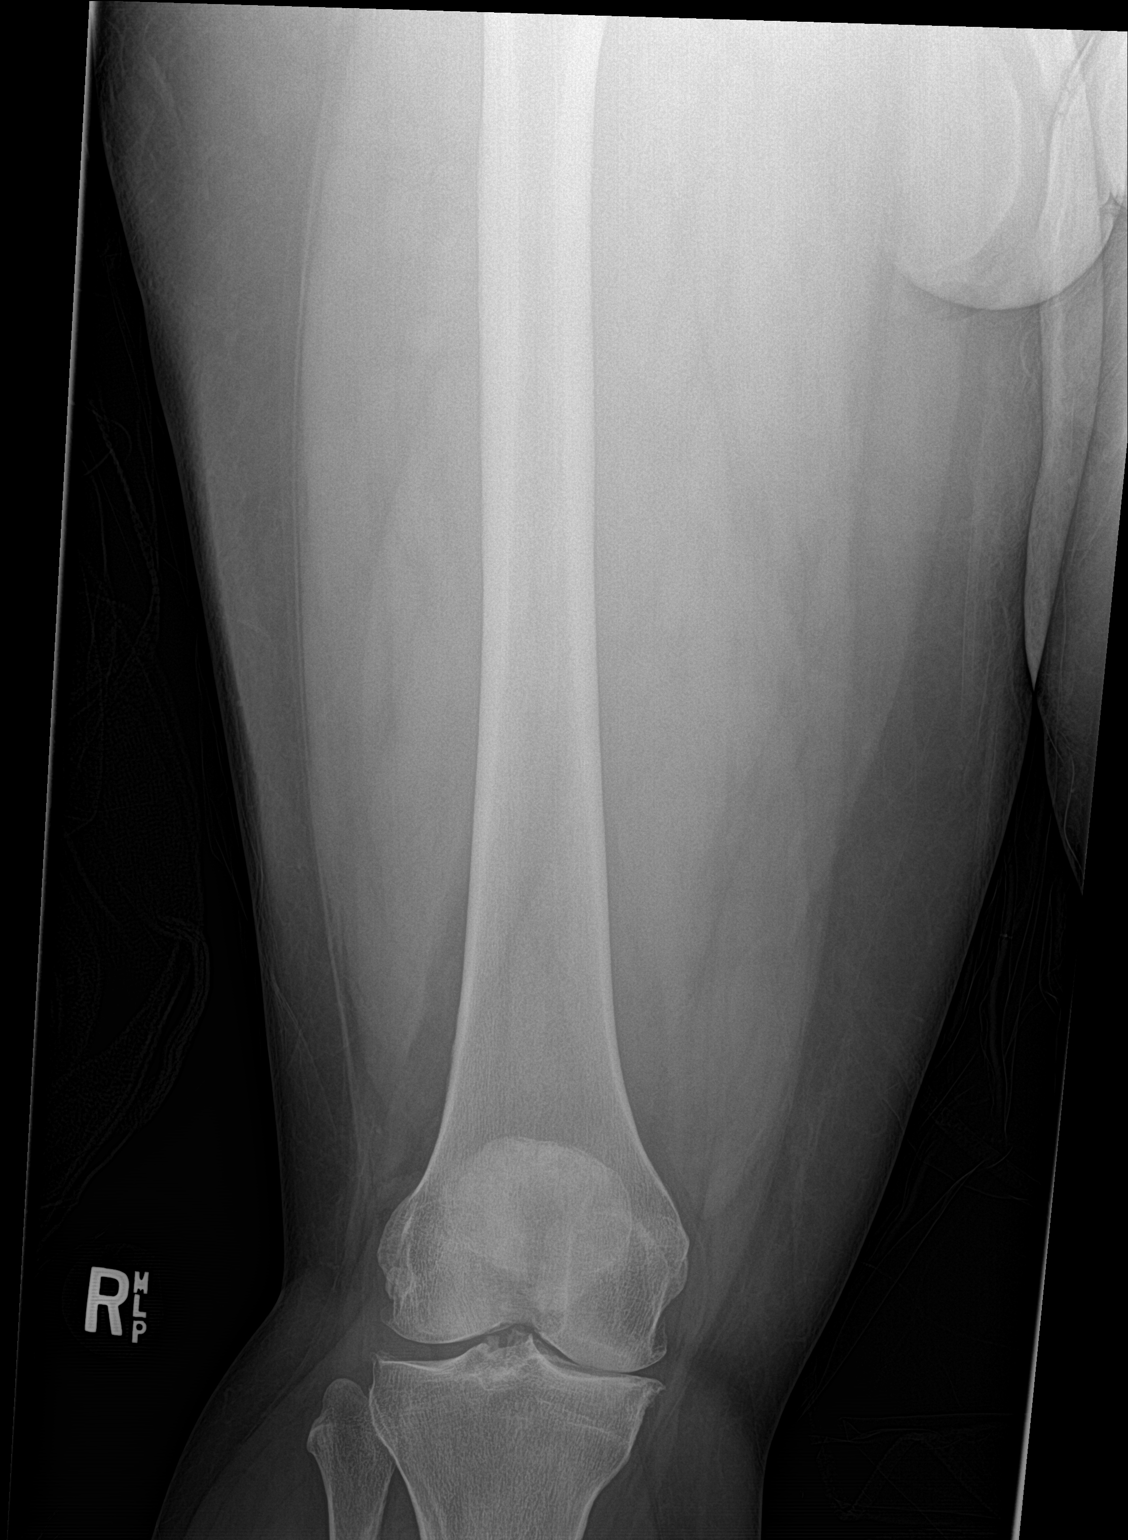

[femur ap (2 of 2)]
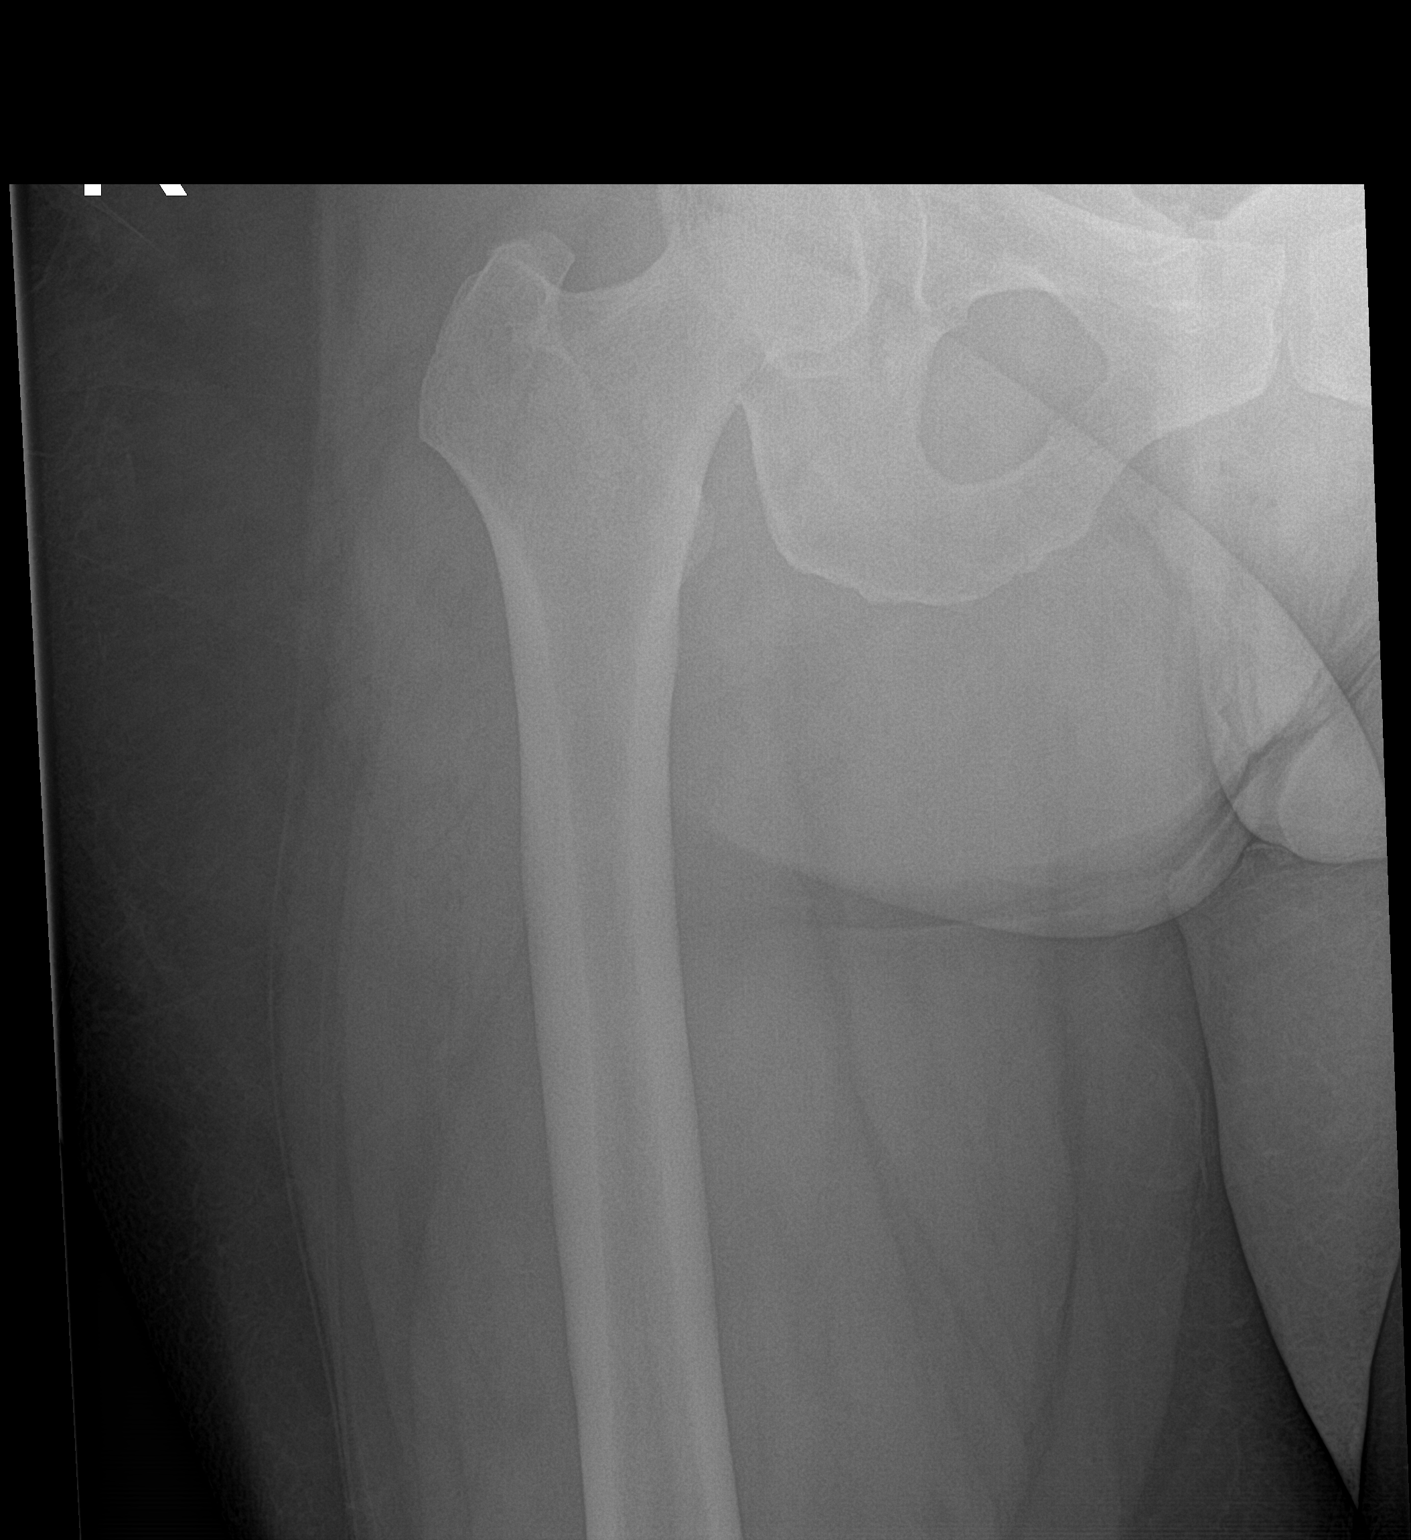

[2 of 2 positions shown; findings below may reference images not displayed]

FINDINGS: The right femur is intact. Only mild degenerative change of the
right hip joint is seen. However there is degenerative joint disease
involving the right knee primarily involving the medial compartment
where there is considerable loss of joint space and sclerosis with
spurring present. Also small loose bodies cannot be excluded within
the right knee joint space.
IMPRESSION: 1. Negative right femur.
2. Degenerative change involves the right knee joint space
particularly medially.

## 2019-03-03 IMAGING — CT CT ABD-PELV W/ CM
2 of 5 series · 16 of 46 positions shown, 18 images · IV contrast (APPLIED)
Comparison: CT abdomen pelvis 08/06/2013

CLINICAL DATA: Abdominal pain and anemia.

EXAM:
CT ABDOMEN AND PELVIS WITH CONTRAST
TECHNIQUE: Multidetector CT imaging of the abdomen and pelvis was performed
using the standard protocol following bolus administration of
intravenous contrast.
CONTRAST:  100mL OMNIPAQUE IOHEXOL 300 MG/ML  SOLN

[Series 2: axial st · axial · 0.76mm/px · z∈[+1148,+1548]mm · 13 of 92 slices shown, 15 images]
[im 6/92  soft-tissue]
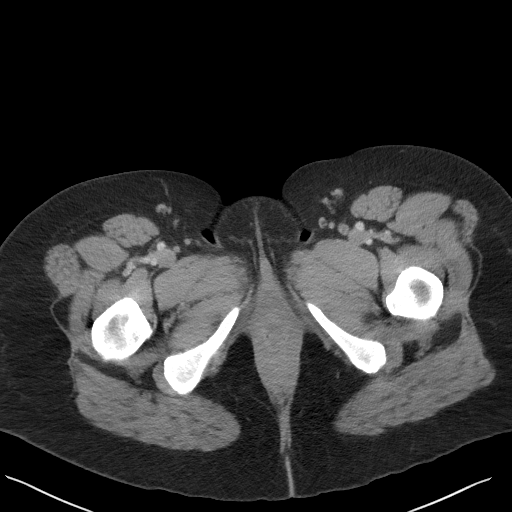
[im 6/92  bone]
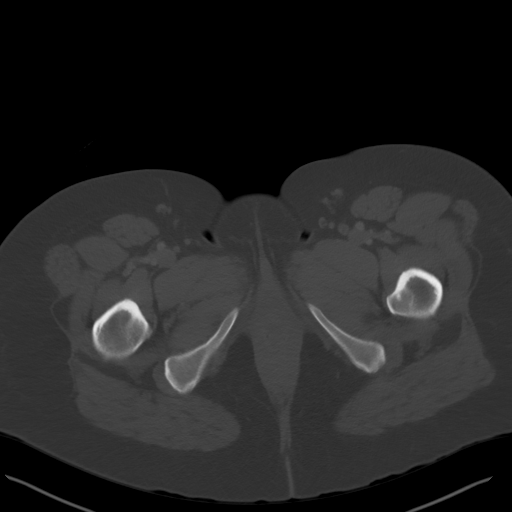
[im 12/92  soft-tissue]
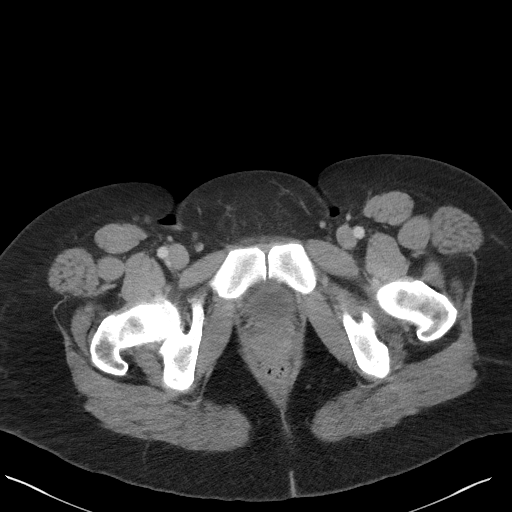
[im 18/92  soft-tissue]
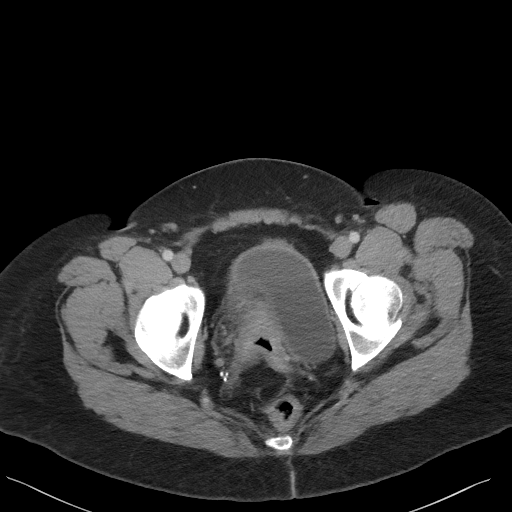
[im 29/92  soft-tissue]
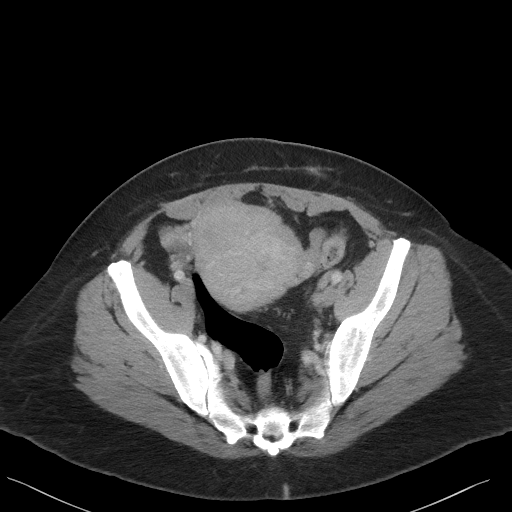
[im 35/92  soft-tissue]
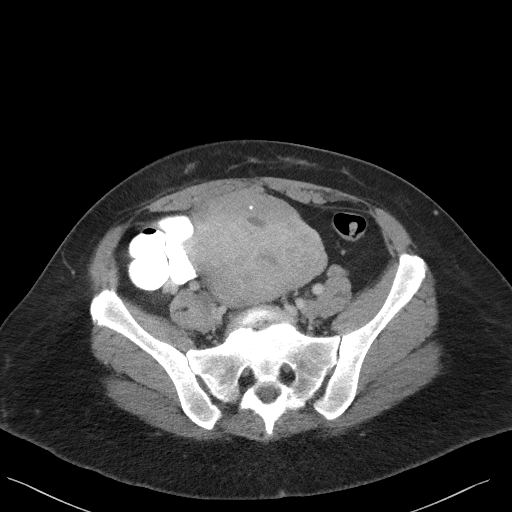
[im 40/92  soft-tissue]
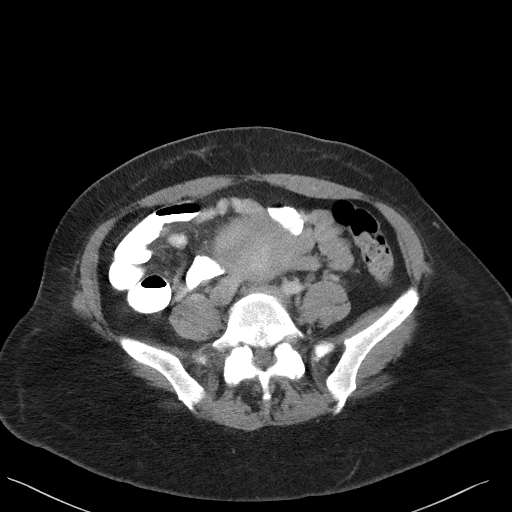
[im 46/92  soft-tissue]
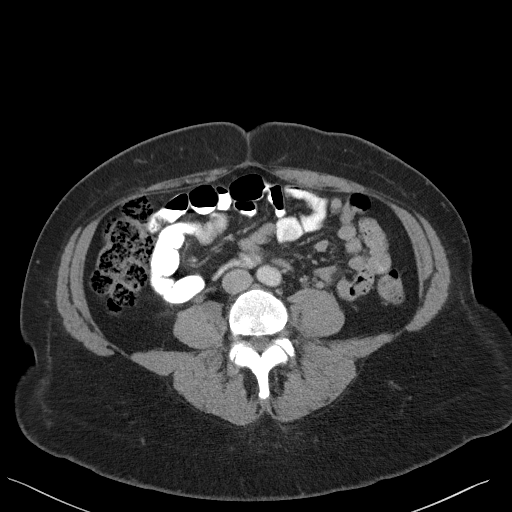
[im 52/92  soft-tissue]
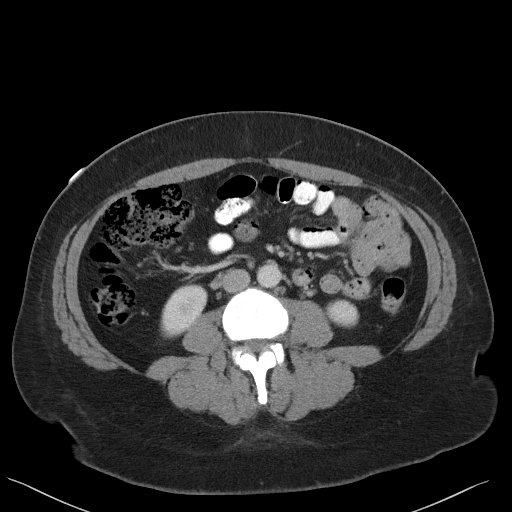
[im 57/92  soft-tissue]
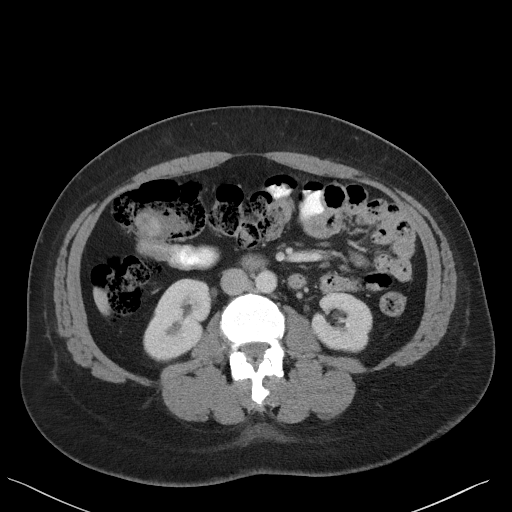
[im 57/92  bone]
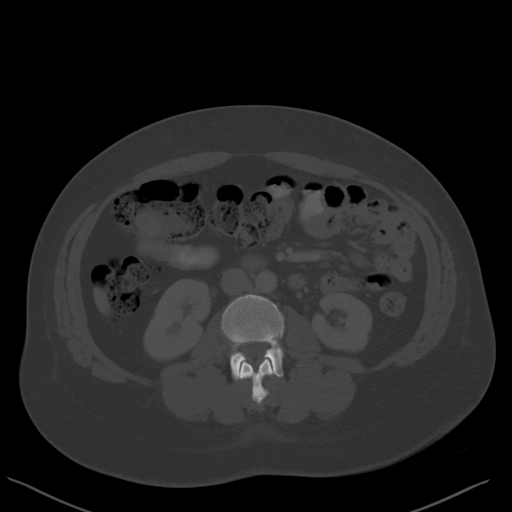
[im 63/92  soft-tissue]
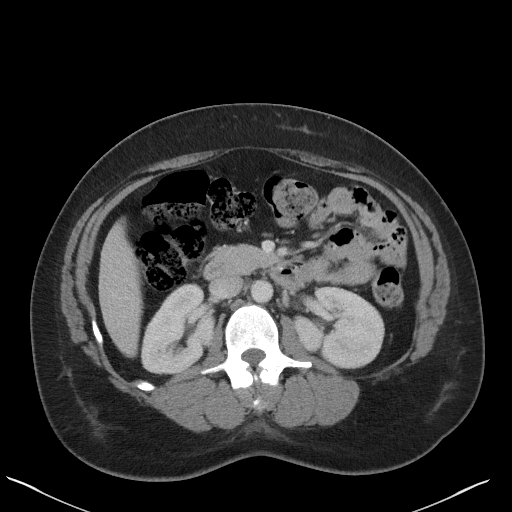
[im 74/92  soft-tissue]
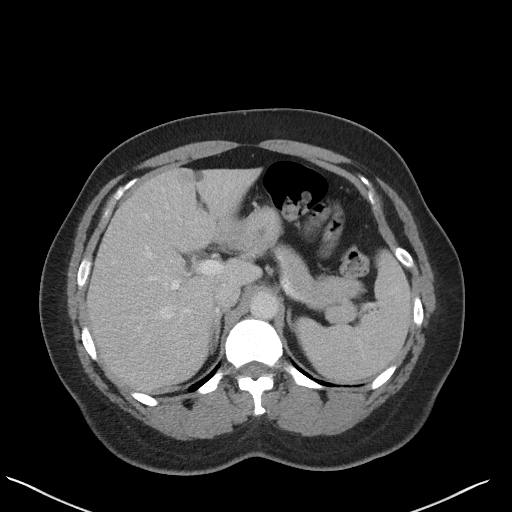
[im 80/92  soft-tissue]
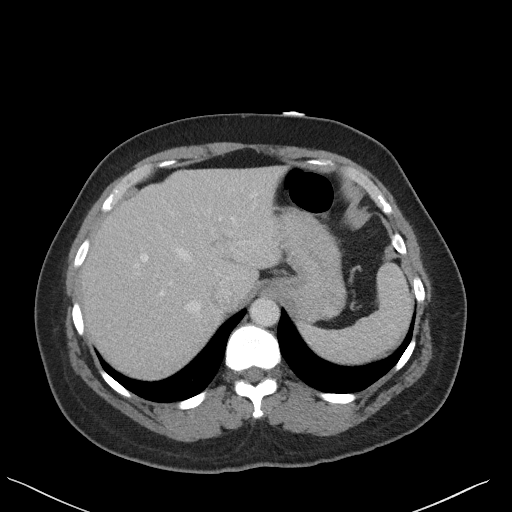
[im 86/92  soft-tissue]
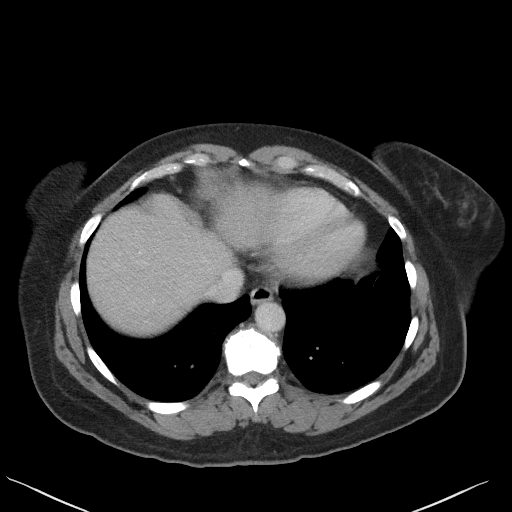

[Series 5: coronal st · coronal · 0.86mm/px · 3 of 82 slices shown]
[im 28/82  soft-tissue]
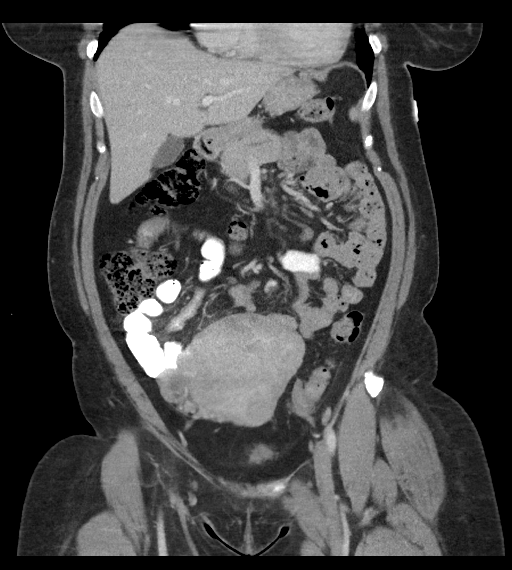
[im 37/82  soft-tissue]
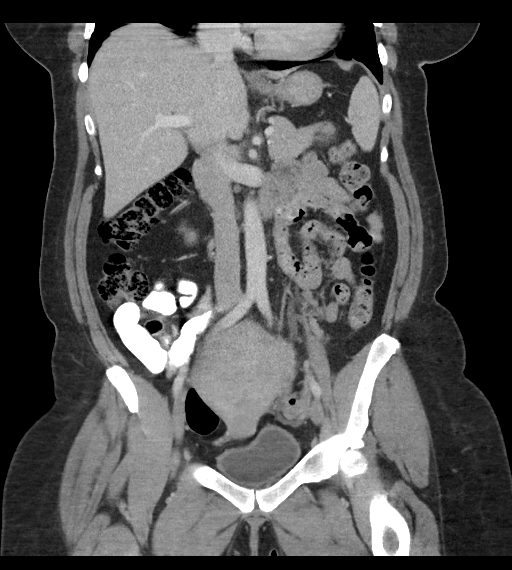
[im 46/82  soft-tissue]
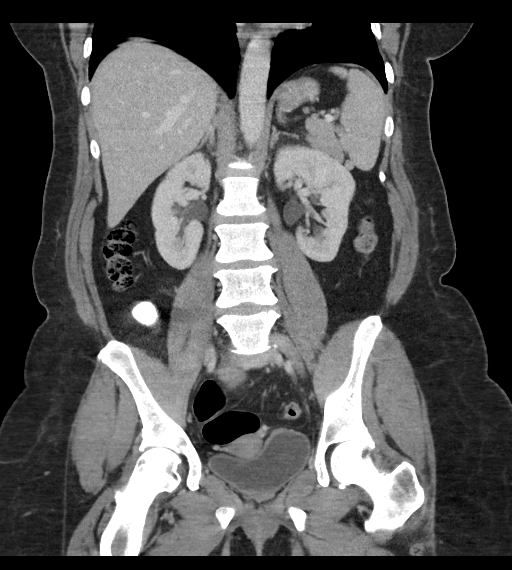

[16 of 46 positions shown; findings below may reference images not displayed]

FINDINGS: LOWER CHEST: There is no basilar pleural or apical pericardial
effusion.

HEPATOBILIARY: The hepatic contours and density are normal. There is
no intra- or extrahepatic biliary dilatation. There is a small stone
at the gallbladder neck. The gallbladder is otherwise normal in
appearance.

PANCREAS: The pancreatic parenchymal contours are normal and there
is no ductal dilatation. There is no peripancreatic fluid
collection.

SPLEEN: Normal.

ADRENALS/URINARY TRACT:

--Adrenal glands: Normal.

--Right kidney/ureter: No hydronephrosis, nephroureterolithiasis,
perinephric stranding or solid renal mass.

--Left kidney/ureter: No hydronephrosis, nephroureterolithiasis,
perinephric stranding or solid renal mass.

--Urinary bladder: Normal for degree of distention

STOMACH/BOWEL:

--Stomach/Duodenum: There is no hiatal hernia or other gastric
abnormality. The duodenal course and caliber are normal.

--Small bowel: No dilatation or inflammation.

--Colon: No focal abnormality.

--Appendix: Not visualized. No right lower quadrant inflammation or
free fluid.

VASCULAR/LYMPHATIC: Normal course and caliber of the major abdominal
vessels. No abdominal or pelvic lymphadenopathy.

REPRODUCTIVE: There are numerous large uterine fibroids, which are
difficult to assess separately. Altogether, the confluent fibroid
mass measures 11.1 x 8.9 x 9.8 cm, previously 15.9 x 9.7 by 16.0 cm.
There are bilateral ovarian cysts.

MUSCULOSKELETAL. No bony spinal canal stenosis or focal osseous
abnormality.

OTHER: None.
IMPRESSION: 1. Multiple large uterine fibroids, altogether measuring 11.1 x
x 9.8 cm, previously 15.9 x 9.7 x 16.0 cm. Fibroids may be a source
of uterine bleeding, though no acute hemorrhage is demonstrated on
this examination.
2. Cholelithiasis without evidence of acute cholecystitis.

These results were discussed by telephone at the time of
interpretation on 03/27/2018 at [DATE] to Dr. TESSIE MACEDONIO , who
verbally acknowledged these results.

## 2019-03-05 ENCOUNTER — Other Ambulatory Visit: Payer: Self-pay | Admitting: Family Medicine

## 2019-03-05 DIAGNOSIS — N938 Other specified abnormal uterine and vaginal bleeding: Secondary | ICD-10-CM

## 2019-03-05 DIAGNOSIS — D219 Benign neoplasm of connective and other soft tissue, unspecified: Secondary | ICD-10-CM

## 2019-03-20 ENCOUNTER — Other Ambulatory Visit: Payer: PRIVATE HEALTH INSURANCE

## 2019-03-29 ENCOUNTER — Other Ambulatory Visit: Payer: PRIVATE HEALTH INSURANCE

## 2019-04-05 ENCOUNTER — Other Ambulatory Visit: Payer: PRIVATE HEALTH INSURANCE

## 2020-02-05 IMAGING — US US PELVIS COMPLETE WITH TRANSVAGINAL
1 series · 13 of 25 positions shown · non-contrast
Comparison: Prior ultrasound from 04/26/2018

CLINICAL DATA: Initial evaluation for uterine fibroids. History of
prior myomectomy.



[Series 1: us pelvis complete with transvaginal · 0.23mm/px · 13 of 40 slices shown]
[im 1/40]
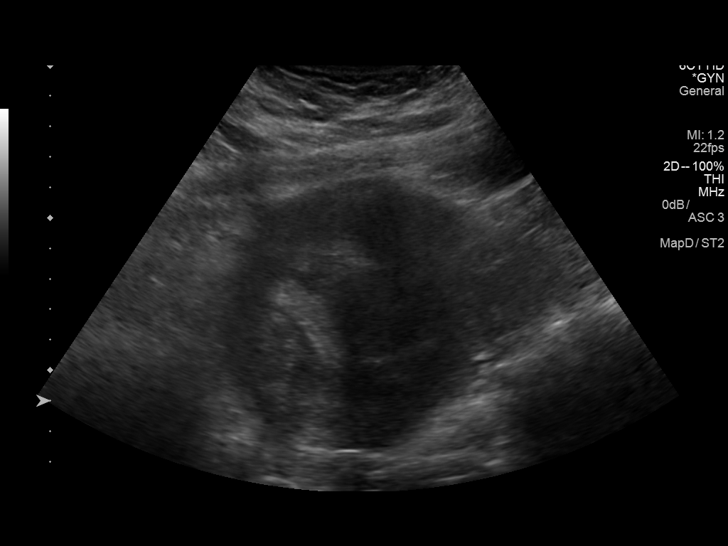
[im 4/40]
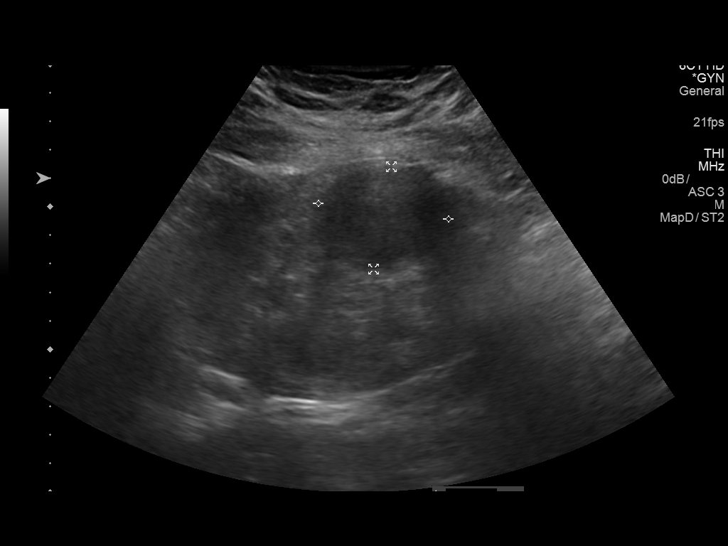
[im 7/40]
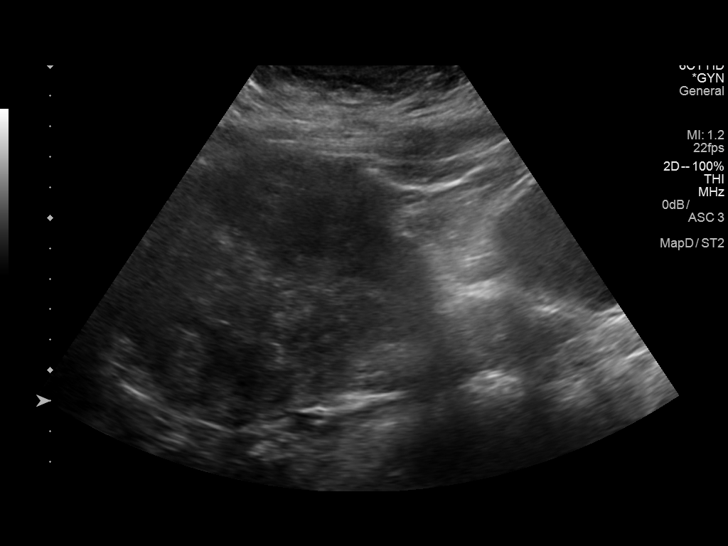
[im 10/40]
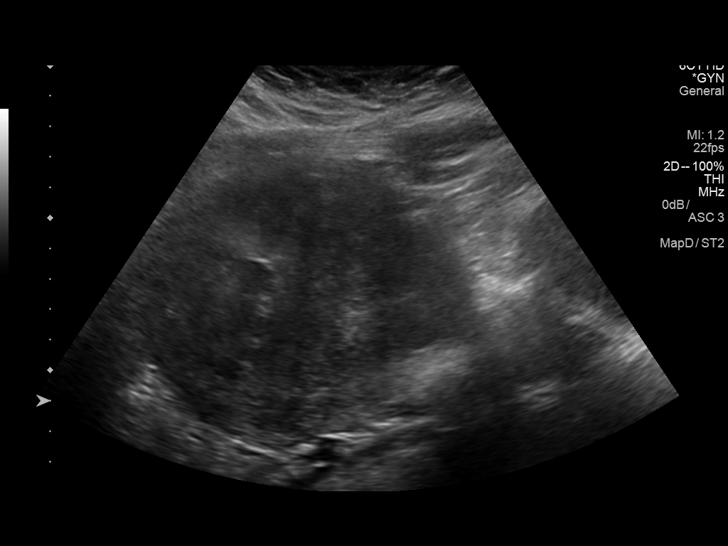
[im 14/40]
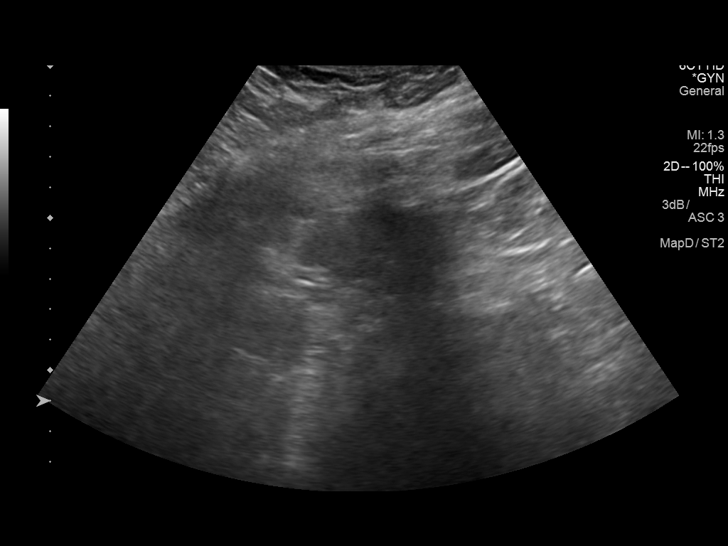
[im 17/40]
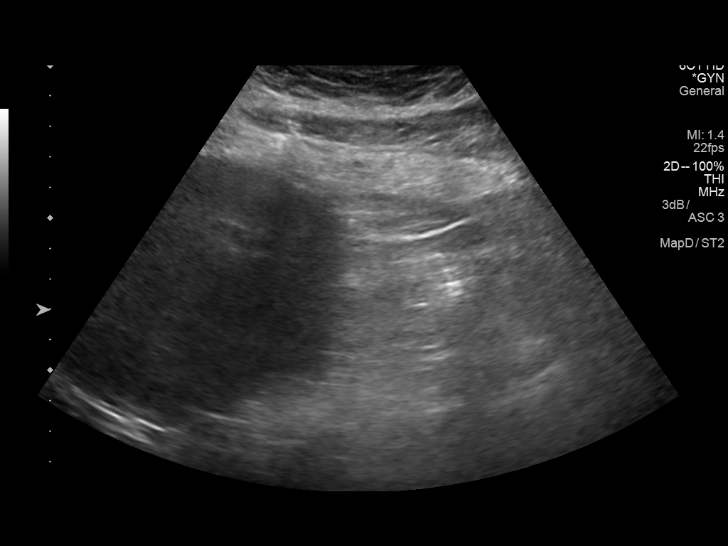
[im 20/40]
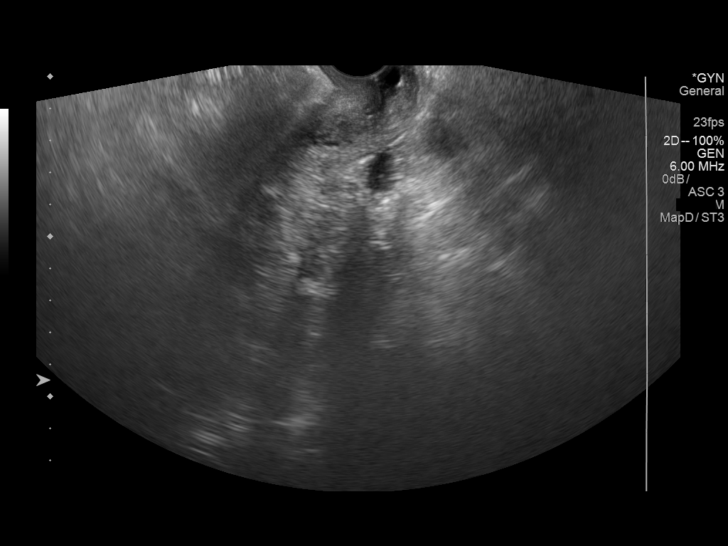
[im 23/40]
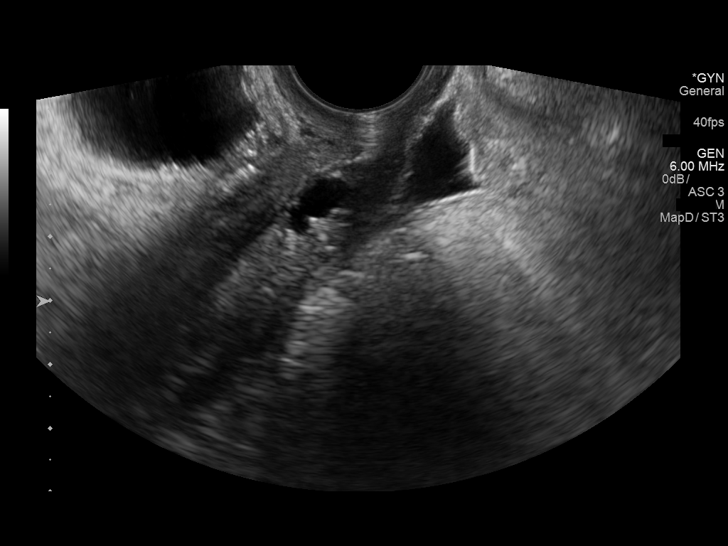
[im 27/40]
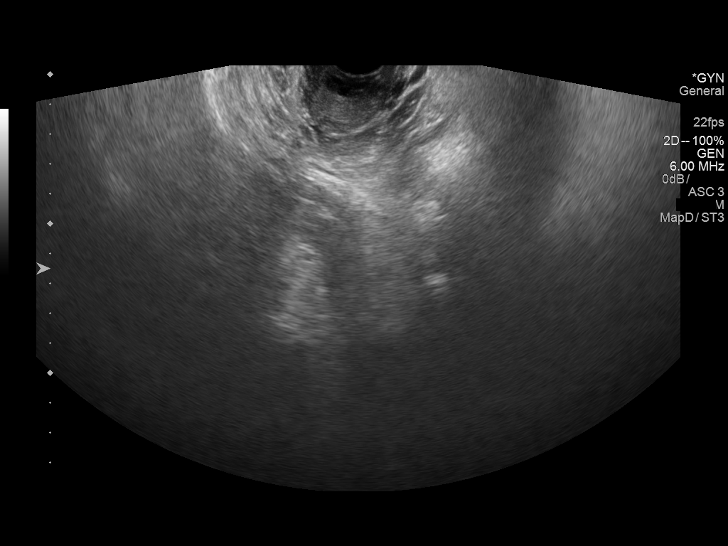
[im 30/40]
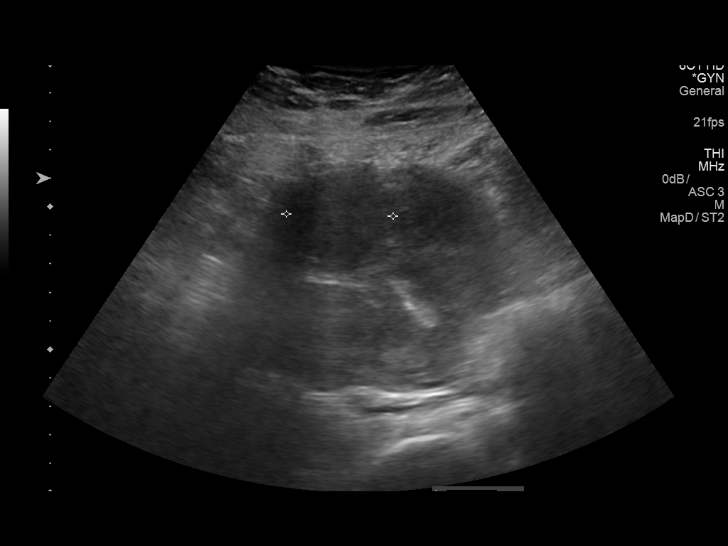
[im 33/40]
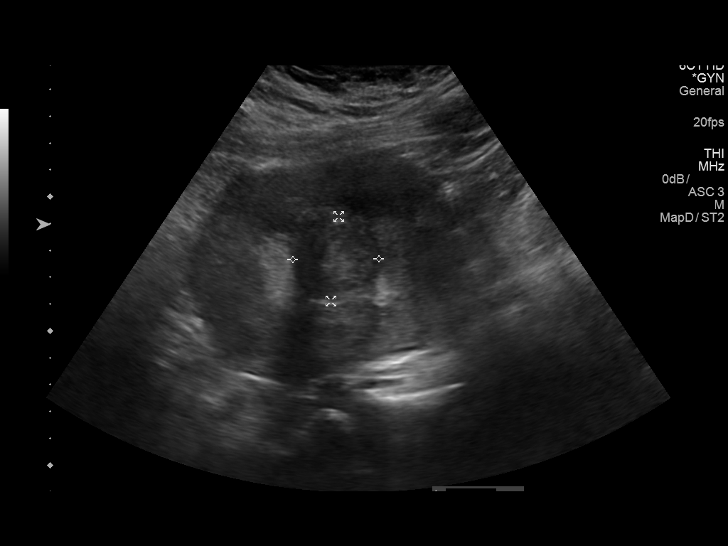
[im 36/40]
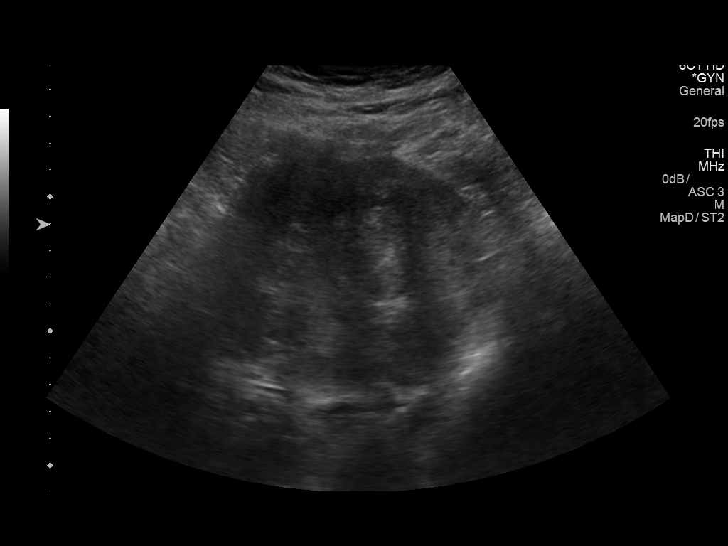
[im 40/40]
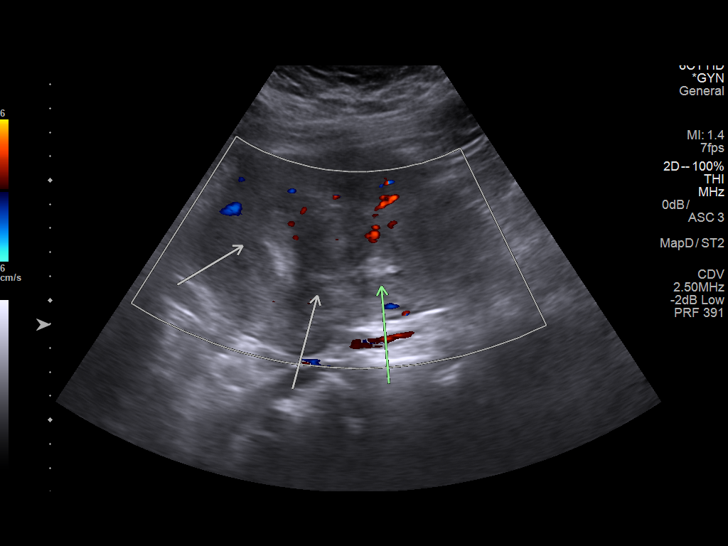

[13 of 25 positions shown; findings below may reference images not displayed]

FINDINGS: Uterus

Measurements: 15.3 x 8.7 x 2.1 cm = volume: 706.9 mL. 4.6 x 3.6 x
3.7 cm intramural fibroid present at the anterior uterine fundus.
Additional submucosal fibroid at the left anterior mid uterine body
measures 3.2 x 3.2 x 2.7 cm. Borders of this fibroid somewhat
difficult to delineate.

Endometrium

Thickness: 11.2 mm. No definite focal abnormality visualized.
Endometrial complex somewhat distorted by adjacent fibroid.

Right ovary

Not visualized.  No adnexal mass.

Left ovary

Not visualized.  No adnexal mass.

Other findings

Trace free physiologic fluid within the pelvis.
IMPRESSION: 1. Fibroid uterus as detailed above.
2. Nonvisualization of the ovaries. No adnexal mass or other acute
finding.

## 2020-03-24 ENCOUNTER — Telehealth: Payer: Self-pay | Admitting: Hematology

## 2020-03-24 NOTE — Telephone Encounter (Signed)
Received an urgent call from Dr. Dennie Fetters office to schedule a new hem appt for severe anemia. Pt's hgb is 6.6. She has been schedule to see Dr. Irene Limbo on 9/22 at 1pm. Janett Billow will notify the pt of the appt date and time.

## 2020-03-25 ENCOUNTER — Inpatient Hospital Stay: Payer: PRIVATE HEALTH INSURANCE

## 2020-03-25 ENCOUNTER — Telehealth: Payer: Self-pay | Admitting: Hematology

## 2020-03-25 ENCOUNTER — Other Ambulatory Visit: Payer: Self-pay

## 2020-03-25 ENCOUNTER — Inpatient Hospital Stay: Payer: PRIVATE HEALTH INSURANCE | Attending: Hematology | Admitting: Hematology

## 2020-03-25 VITALS — BP 135/63 | HR 94 | Temp 98.0°F | Resp 18 | Ht 60.5 in | Wt 236.9 lb

## 2020-03-25 DIAGNOSIS — E559 Vitamin D deficiency, unspecified: Secondary | ICD-10-CM | POA: Insufficient documentation

## 2020-03-25 DIAGNOSIS — D509 Iron deficiency anemia, unspecified: Secondary | ICD-10-CM

## 2020-03-25 DIAGNOSIS — N920 Excessive and frequent menstruation with regular cycle: Secondary | ICD-10-CM | POA: Diagnosis not present

## 2020-03-25 LAB — TYPE AND SCREEN
ABO/RH(D): A POS
Antibody Screen: NEGATIVE

## 2020-03-25 LAB — CBC WITH DIFFERENTIAL/PLATELET
Abs Immature Granulocytes: 0.08 10*3/uL — ABNORMAL HIGH (ref 0.00–0.07)
Basophils Absolute: 0 10*3/uL (ref 0.0–0.1)
Basophils Relative: 1 %
Eosinophils Absolute: 0 10*3/uL (ref 0.0–0.5)
Eosinophils Relative: 1 %
HCT: 26.3 % — ABNORMAL LOW (ref 36.0–46.0)
Hemoglobin: 7.1 g/dL — ABNORMAL LOW (ref 12.0–15.0)
Immature Granulocytes: 1 %
Lymphocytes Relative: 16 %
Lymphs Abs: 1 10*3/uL (ref 0.7–4.0)
MCH: 18.2 pg — ABNORMAL LOW (ref 26.0–34.0)
MCHC: 27 g/dL — ABNORMAL LOW (ref 30.0–36.0)
MCV: 67.4 fL — ABNORMAL LOW (ref 80.0–100.0)
Monocytes Absolute: 0.5 10*3/uL (ref 0.1–1.0)
Monocytes Relative: 8 %
Neutro Abs: 4.3 10*3/uL (ref 1.7–7.7)
Neutrophils Relative %: 73 %
Platelets: 290 10*3/uL (ref 150–400)
RBC: 3.9 MIL/uL (ref 3.87–5.11)
RDW: 20.1 % — ABNORMAL HIGH (ref 11.5–15.5)
WBC: 5.9 10*3/uL (ref 4.0–10.5)
nRBC: 0.3 % — ABNORMAL HIGH (ref 0.0–0.2)

## 2020-03-25 LAB — IRON AND TIBC
Iron: 18 ug/dL — ABNORMAL LOW (ref 41–142)
Saturation Ratios: 4 % — ABNORMAL LOW (ref 21–57)
TIBC: 460 ug/dL — ABNORMAL HIGH (ref 236–444)
UIBC: 442 ug/dL — ABNORMAL HIGH (ref 120–384)

## 2020-03-25 LAB — CMP (CANCER CENTER ONLY)
ALT: 17 U/L (ref 0–44)
AST: 18 U/L (ref 15–41)
Albumin: 3.8 g/dL (ref 3.5–5.0)
Alkaline Phosphatase: 78 U/L (ref 38–126)
Anion gap: 5 (ref 5–15)
BUN: 6 mg/dL (ref 6–20)
CO2: 26 mmol/L (ref 22–32)
Calcium: 9.8 mg/dL (ref 8.9–10.3)
Chloride: 106 mmol/L (ref 98–111)
Creatinine: 0.72 mg/dL (ref 0.44–1.00)
GFR, Est AFR Am: >60 mL/min (ref >60)
GFR, Estimated: >60 mL/min (ref >60)
Glucose, Bld: 110 mg/dL — ABNORMAL HIGH (ref 70–99)
Potassium: 3.9 mmol/L (ref 3.5–5.1)
Sodium: 137 mmol/L (ref 135–145)
Total Bilirubin: 0.4 mg/dL (ref 0.3–1.2)
Total Protein: 7.9 g/dL (ref 6.5–8.1)

## 2020-03-25 LAB — VITAMIN B12: Vitamin B-12: 498 pg/mL (ref 180–914)

## 2020-03-25 LAB — FERRITIN: Ferritin: 6 ng/mL — ABNORMAL LOW (ref 11–307)

## 2020-03-25 NOTE — Telephone Encounter (Signed)
Scheduled appointments per 9/22 los. Spoke to patient who is aware of upcoming appointments. Gave patient calendar print out.

## 2020-03-25 NOTE — Patient Instructions (Signed)
Thank you for choosing [facility] to provide your oncology and hematology care.   Should you have questions after your visit to the [facility] ([facility]), please contact this office at [phone] between 8:30 AM and 4:30 PM.  Voice mails left after 4:00 PM may not be returned until the following business day.  Calls received after 4:30 PM will be answered by an off-site Nurse Triage Line.    Prescription Refills:  Please have your pharmacy contact us directly for most prescription requests.  Contact the office directly for refills of narcotics (pain medications). Allow 48-72 hours for refills.  Appointments: Please contact the [facility] scheduling department [phone] for questions regarding [facility] appointment scheduling.  Contact the schedulers with any scheduling changes so that your appointment can be rescheduled in a timely manner.   Central Scheduling for [place] [phone] - Call to schedule procedures such as PET scans, CT scans, MRI, Ultrasound, etc.  To afford each patient quality time with our providers, please arrive 30 minutes before your scheduled appointment time.  If you arrive late for your appointment, you may be asked to reschedule.  We strive to give you quality time with our providers, and arriving late affects you and other patients whose appointments are after yours. If you are a no show for multiple scheduled visits, you may be dismissed from the clinic at the providers discretion.     Resources: [facility] Social Workers [phone] for additional information on assistance programs or assistance connecting with community support programs   [place] DSS  336-641-3447: Information regarding food stamps, Medicaid, and utility assistance [place] [employer] [place] 336-333-6589   [place] [employer]'s shared-ride transportation service for eligible riders who have a disability that prevents them from riding the fixed route bus.   Medicare  Rights Center 800-333-4114 Helps people with Medicare understand their rights and benefits, navigate the Medicare system, and secure the quality healthcare they deserve [employer] 800-227-2345 Assists patients locate various types of support and financial assistance Cancer Care: 1-800-813-HOPE (4673) Provides financial assistance, online support groups, medication/co-pay assistance.   Transportation Assistance for appointments at [facility]: Transportation Coordinator [phone]  Again, thank you for choosing [facility] for your care.       

## 2020-03-25 NOTE — Progress Notes (Signed)
HEMATOLOGY/ONCOLOGY CONSULTATION NOTE  Date of Service: 03/25/2020  Patient Care Team: Hayden Rasmussen, MD as PCP - General (Family Medicine)  CHIEF COMPLAINTS/PURPOSE OF CONSULTATION:  Severe anemia  HISTORY OF PRESENTING ILLNESS:  Joanna Gross is a wonderful 54 y.o. female who has been referred to [place] by Dr. Darron Doom for evaluation and management of severe anemia. Pt is accompanied today by her mother. The pt reports that she is doing well overall.   The pt reports that her menstrual cycles last 10-12 days, with 2-3 heavy days. Pt has a history of Fibroids and two myomectomies. She currently has a fibroid. She has never used oral birth control. Pt has used Progesterone once to halt her period. Pt is currently following with an OBGYN.   Pt has a long history of anemia and required two units of blood in 2019. She has never been given IV Iron. She was given PO Iron, but was unable to continue taking it as it was causing constipation. Pt has been taking prenatal iron since 2019 and was switched to Fusion Plus iron on Monday. Pt has not had a Colonoscopy since turning 52 and reports regular bowel movements. She has significant ice cravings.  She currently has a sinus infection and began taking Doxycycline on Monday. Pt has lost 15 lbs in the last month with effort. She has recently developed new allergies. She has a history of Vitamin D deficiency. 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 Pt has no family history of blood disorders. Pt has had ankle swelling and restless legs at night recently.   Most recent lab results (03/03/2020) of CBC is as follows: all values are WNL except for Hgb at 6.6, MCV at 69. 03/03/2020 Ferritin at 4, Iron Sat 3.  05/18/2019 Vitamin D 25 (OH) at 16.7 05/18/2019 Ferritin at 13.2  On review of systems, pt reports palpitations, headaches, SOB, dizziness, lightheadedness, left forearm numbness, muscle cramping, numbness/tingling in legs and  denies bloody/black stools, bowel habit changes, fevers, abdominal pain, unexpected weight loss and any other symptoms.   On PMHx the pt reports Anemia, Heart murmur, Vitamin D deficiency, Fibroids, Myomectomy x2.  MEDICAL HISTORY:  Past Medical History:  Diagnosis Date  . Anemia   . Heart murmur   . Seizures ([place]) 1975   1 episode.  No meds since 1988    SURGICAL HISTORY: Past Surgical History:  Procedure Laterality Date  . BREAST SURGERY  nov. 2010   fibroid removed from right breast (3x)  . BREAST SURGERY  1989   fibroid removed from left breast  . BUNIONECTOMY  1999  . CYSTECTOMY  1974   left side of neck   . UTERINE FIBROID SURGERY  october 2003    SOCIAL HISTORY: Social History   Socioeconomic History  . Marital status: Divorced    Spouse name: n/a  . Number of children: 0  . Years of education: Not on file  . Highest education level: Not on file  Occupational History  . Occupation: [employer]    Comment: self-employed  Tobacco Use  . Smoking status: Never Smoker  . Smokeless tobacco: Never Used  Vaping Use  . Vaping Use: Never used  Substance and Sexual Activity  . Alcohol use: Yes    Alcohol/week: 0.0 - 1.0 standard drinks    Comment: RARE  . Drug use: No  . Sexual activity: Not Currently    Partners: Male    Birth control/protection: Condom  Comment: 1st intercourse- 57, partners- 20, divorced   Other Topics Concern  . Not on file  Social History Narrative  . Not on file   Social Determinants of Health   Financial Resource Strain:   . Difficulty of Paying Living Expenses: Not on file  Food Insecurity:   . Worried About Charity fundraiser in the Last Year: Not on file  . Ran Out of Food in the Last Year: Not on file  Transportation Needs:   . Lack of Transportation (Medical): Not on file  . Lack of Transportation (Non-Medical): Not on file  Physical Activity:   . Days of Exercise per Week: Not on file  . Minutes of Exercise per Session:  Not on file  Stress:   . Feeling of Stress : Not on file  Social Connections:   . Frequency of Communication with Friends and Family: Not on file  . Frequency of Social Gatherings with Friends and Family: Not on file  . Attends Religious Services: Not on file  . Active Member of Clubs or Organizations: Not on file  . Attends Archivist Meetings: Not on file  . Marital Status: Not on file  Intimate Partner Violence:   . Fear of Current or Ex-Partner: Not on file  . Emotionally Abused: Not on file  . Physically Abused: Not on file  . Sexually Abused: Not on file    FAMILY HISTORY: Family History  Problem Relation Age of Onset  . Arthritis Mother   . Hypertension Father   . Diabetes Father     ALLERGIES:  is allergic to amoxicillin, furosemide, penicillins, bee venom, erythromycin, and eucalyptus flavor [flavoring agent].  MEDICATIONS:  Current Outpatient Medications  Medication Sig Dispense Refill  . famotidine (PEPCID) 20 MG tablet Take 20 mg by mouth 2 (two) times daily.    . ferrous sulfate 325 (65 FE) MG tablet Take 1 tablet (325 mg total) by mouth daily with breakfast. 30 tablet 0  . lidocaine (LIDODERM) 5 % Place 1 patch onto the skin daily. Remove & Discard patch within 12 hours or as directed by MD 30 patch 6  . naproxen sodium (ALEVE) 220 MG tablet Take 220 mg by mouth.    . Prenatal Vit-Fe Fumarate-FA (PRENATAL MULTIVITAMIN) TABS tablet Take 1 tablet by mouth daily at 12 noon.     No current facility-administered medications for this visit.    REVIEW OF SYSTEMS:    10 Point review of Systems was done is negative except as noted above.  PHYSICAL EXAMINATION: ECOG PERFORMANCE STATUS: 1 - Symptomatic but completely ambulatory  . Vitals:   03/25/20 1312  BP: 135/63  Pulse: 94  Resp: 18  Temp: 98 F (36.7 C)  SpO2: 100%   Filed Weights   03/25/20 1312  Weight: 236 lb 14.4 oz (107.5 kg)   .Body mass index is 45.5 kg/m.  GENERAL:alert, in no  acute distress and comfortable SKIN: no acute rashes, no significant lesions EYES: conjunctiva are pink and non-injected, sclera anicteric OROPHARYNX: MMM, no exudates, no oropharyngeal erythema or ulceration NECK: supple, no JVD LYMPH:  no palpable lymphadenopathy in the cervical, axillary or inguinal regions LUNGS: clear to auscultation b/l with normal respiratory effort HEART: regular rate & rhythm ABDOMEN:  normoactive bowel sounds , non tender, not distended. Extremity: no pedal edema PSYCH: alert & oriented x 3 with fluent speech NEURO: no focal motor/sensory deficits  LABORATORY DATA:  I have reviewed the data as listed  . CBC Latest  Ref Rng & Units 03/25/2020 03/27/2018 03/26/2018  WBC 4.0 - 10.5 K/uL 5.9 5.5 4.4  Hemoglobin 12.0 - 15.0 g/dL 7.1(L) 8.9(L) 6.5(LL)  Hematocrit 36 - 46 % 26.3(L) 31.0(L) 24.9(L)  Platelets 150 - 400 K/uL 290 265 315   . CBC    Component Value Date/Time   WBC 5.9 03/25/2020 1409   RBC 3.90 03/25/2020 1409   HGB 7.1 (L) 03/25/2020 1409   HCT 26.3 (L) 03/25/2020 1409   HCT 25.8 (L) 03/25/2020 1409   PLT 290 03/25/2020 1409   MCV 67.4 (L) 03/25/2020 1409   MCV 79.1 (A) 08/06/2013 1534   MCH 18.2 (L) 03/25/2020 1409   MCHC 27.0 (L) 03/25/2020 1409   RDW 20.1 (H) 03/25/2020 1409   LYMPHSABS 1.0 03/25/2020 1409   MONOABS 0.5 03/25/2020 1409   EOSABS 0.0 03/25/2020 1409   BASOSABS 0.0 03/25/2020 1409    . CMP Latest Ref Rng & Units 03/25/2020 03/26/2018 03/26/2018  Glucose 70 - 99 mg/dL 110(H) 97 98  BUN 6 - 20 mg/dL 6 9 8   Creatinine 0.44 - 1.00 mg/dL 0.72 0.74 0.63  Sodium 135 - 145 mmol/L 137 137 135  Potassium 3.5 - 5.1 mmol/L 3.9 4.2 3.9  Chloride 98 - 111 mmol/L 106 103 101  CO2 22 - 32 mmol/L 26 26 24   Calcium 8.9 - 10.3 mg/dL 9.8 9.6 9.5  Total Protein 6.5 - 8.1 g/dL 7.9 8.1 8.0  Total Bilirubin 0.3 - 1.2 mg/dL 0.4 0.8 0.3  Alkaline Phos 38 - 126 U/L 78 56 62  AST 15 - 41 U/L 18 37 23  ALT 0 - 44 U/L 17 18 16    . Lab Results   Component Value Date   IRON 18 (L) 03/25/2020   TIBC 460 (H) 03/25/2020   IRONPCTSAT 4 (L) 03/25/2020   (Iron and TIBC)  Lab Results  Component Value Date   FERRITIN 6 (L) 03/25/2020     RADIOGRAPHIC STUDIES: I have personally reviewed the radiological images as listed and agreed with the findings in the report. No results found.  ASSESSMENT & PLAN:   54 yo with   1) Severe iron deficiency with microcytic severe anemia PLAN: -Discussed patient's most recent labs from 03/03/2020, Hgb at 6.6, MCV at 69, Ferritin at 4, Iron Sat 3. -Discussed 05/18/2019 Ferritin at 13.2, Vitamin D 25 (OH) at 16.7.  -Advised pt that significant anemia will cause increased heart strain and high blood pressure. This may be the cause of palpitations and headaches.  -Advised pt that her iron deficiency and anemia is likely from heavy menstrual losses.  -Advised pt that PO Iron takes a while to build iron stores. This will be exacerbated by her ongoing blood loss. -Recommend pt receive IV Iron as it will correct iron deficiency sooner. Pt has history of PO Iron intolerance. -Advised pt that newer preparations of IV Iron have a lower risk of allergic reactions. Would still pre-medicate pt due to significant allergies.  -Not unreasonable for pt to continue taking Fusion Plus (130 mg Iron) after IV Iron to maintain Ferritin levels.  -Recommend pt avoid activities with an elevated risk of injury such as climbing ladders or operating heavy machinery.  -Recommended that the pt drink at least 48-64 oz of water each day.  -Recommend pt f/u with OBGYN for menorrhagia management.  -Recommend pt begin an OTC B-complex daily.  -Would hold blood transfusion if Hgb >7.  -Will give IV Injectafer weekly x2 ASAP -Will get labs today for  baseline -Will see back in 2 months with labs     FOLLOW UP: Labs today IV INjectafer weekly x 2 doses ASAP RTC with Dr Irene Limbo with labs in 8 weeks45   All of the patients  questions were answered with apparent satisfaction. The patient knows to call the clinic with any problems, questions or concerns.  I spent 40 mins counseling the patient face to face. The total time spent in the appointment was 60 minutes and more than 50% was on counseling and direct patient cares.    Sullivan Lone MD [place] AAHIVMS [facility] Hematology/Oncology Physician [facility]  (Office):       [phone] (Work cell):  [phone] (Fax):           [phone]  03/25/2020 5:01 PM  I, Yevette Edwards, am acting as a scribe for Dr. Sullivan Lone.   .I have reviewed the above documentation for accuracy and completeness, and I agree with the above. Brunetta Genera MD

## 2020-03-26 ENCOUNTER — Ambulatory Visit
Admission: EM | Admit: 2020-03-26 | Discharge: 2020-03-26 | Disposition: A | Discharge status: Home / Self Care | Payer: PRIVATE HEALTH INSURANCE

## 2020-03-26 ENCOUNTER — Ambulatory Visit: Payer: Self-pay

## 2020-03-26 DIAGNOSIS — R220 Localized swelling, mass and lump, head: Secondary | ICD-10-CM

## 2020-03-26 DIAGNOSIS — K047 Periapical abscess without sinus: Secondary | ICD-10-CM

## 2020-03-26 LAB — FOLATE RBC
Folate, Hemolysate: 415 ng/mL
Folate, RBC: 1609 ng/mL (ref >498)
Hematocrit: 25.8 % — ABNORMAL LOW (ref 34.0–46.6)

## 2020-03-26 MED ORDER — HYDROCODONE-ACETAMINOPHEN 5-325 MG PO TABS: 1.0000 | ORAL_TABLET | Freq: Four times a day (QID) | ORAL | 0 refills | Status: DC | PRN

## 2020-03-26 MED ORDER — CLINDAMYCIN HCL 300 MG PO CAPS: 300.0000 mg | ORAL_CAPSULE | Freq: Three times a day (TID) | ORAL | 0 refills | Status: AC

## 2020-03-26 MED ORDER — KETOROLAC TROMETHAMINE 30 MG/ML IJ SOLN
30.0000 mg | Freq: Once | INTRAMUSCULAR | Status: AC
  Administered 2020-03-26: 30 mg via INTRAMUSCULAR

## 2020-03-26 NOTE — ED Triage Notes (Signed)
Pt states is currently treated for a bacterial sinus infection since Monday. States being tx'd for lt sided facial swelling with lt gum pain/swelling by dentist. States has been on antibiotics and NSAIDS

## 2020-03-26 NOTE — Discharge Instructions (Signed)
Begin clindamycin every 8 hours for infection Warm compresses to face Use anti-inflammatories for pain/swelling. You may take up to 800 mg Ibuprofen every 8 hours with food. You may supplement Ibuprofen with Tylenol [phone] mg every 8 hours.  Hydrocodone for severe pain If not improving or worsening, follow up in the emergency room

## 2020-03-27 ENCOUNTER — Other Ambulatory Visit: Payer: Self-pay | Admitting: Hematology

## 2020-03-27 DIAGNOSIS — D509 Iron deficiency anemia, unspecified: Secondary | ICD-10-CM | POA: Insufficient documentation

## 2020-03-27 NOTE — ED Provider Notes (Signed)
[facility]    CSN: [id number] Arrival date & time: 03/26/20  1115      History   Chief Complaint Chief Complaint  Patient presents with  . Facial Pain    HPI Joanna Gross is a 54 y.o. female history of anemia presenting today for evaluation of facial pain and swelling.  Patient reports that she is developed increased facial pain and swelling on the left side of her face.  She reports that she began treatment for sinusitis with doxycycline beginning Monday.  Since her pain and swelling has worsened.  She is also concerned about possible relation to dental infection as she has had some swelling and gum pain.  Using NSAIDs without relief.  Pain radiates towards the eye as well as ear and neck.  Denies fevers.  HPI  Past Medical History:  Diagnosis Date  . Anemia   . Heart murmur   . Seizures ([place]) 1975   1 episode.  No meds since 1988    Patient Active Problem List   Diagnosis Date Noted  . Iron deficiency anemia 03/27/2020  . Symptomatic anemia 03/26/2018  . Left breast mass 05/24/2011  . CHEST PAIN 05/16/2007    Past Surgical History:  Procedure Laterality Date  . BREAST SURGERY  nov. 2010   fibroid removed from right breast (3x)  . BREAST SURGERY  1989   fibroid removed from left breast  . BUNIONECTOMY  1999  . CYSTECTOMY  1974   left side of neck   . UTERINE FIBROID SURGERY  october 2003    OB History    Gravida  1   Para  0   Term      Preterm      AB  1   Living  0     SAB      TAB      Ectopic      Multiple      Live Births               Home Medications    Prior to Admission medications   Medication Sig Start Date End Date Taking? Authorizing Provider  acetaminophen-codeine (TYLENOL #3) 300-30 MG tablet Take 1 tablet by mouth every 6 (six) hours as needed for moderate pain.   Yes [provider]  doxycycline (VIBRAMYCIN) 100 MG capsule Take 100 mg by mouth daily.   Yes [provider]  Fe  Fum-Fe Poly-Vit C-Lactobac (FUSION PO) Take by mouth.   Yes [provider]  ibuprofen (ADVIL) 800 MG tablet Take 800 mg by mouth every 8 (eight) hours as needed.   Yes [provider]  clindamycin (CLEOCIN) 300 MG capsule Take 1 capsule (300 mg total) by mouth 3 (three) times daily for 7 days. 03/26/20 04/02/20  Valerie Cones C, PA-C  HYDROcodone-acetaminophen (NORCO/VICODIN) 5-325 MG tablet Take 1-2 tablets by mouth every 6 (six) hours as needed for severe pain. 03/26/20   Adelia Baptista, Elesa Hacker, PA-C    Family History Family History  Problem Relation Age of Onset  . Arthritis Mother   . Hypertension Father   . Diabetes Father     Social History Social History   Tobacco Use  . Smoking status: Never Smoker  . Smokeless tobacco: Never Used  Vaping Use  . Vaping Use: Never used  Substance Use Topics  . Alcohol use: Yes    Alcohol/week: 0.0 - 1.0 standard drinks    Comment: RARE  . Drug use: No  Allergies   Amoxicillin, Furosemide, Penicillins, Bee venom, Erythromycin, and Eucalyptus flavor [flavoring agent]   Review of Systems Review of Systems  Constitutional: Negative for activity change, appetite change, chills, fatigue and fever.  HENT: Positive for dental problem and sinus pressure. Negative for congestion, ear pain, rhinorrhea, sore throat and trouble swallowing.   Eyes: Negative for discharge and redness.  Respiratory: Negative for cough, chest tightness and shortness of breath.   Cardiovascular: Negative for chest pain.  Gastrointestinal: Negative for abdominal pain, diarrhea, nausea and vomiting.  Musculoskeletal: Negative for myalgias.  Skin: Negative for rash.  Neurological: Negative for dizziness, light-headedness and headaches.     Physical Exam Triage Vital Signs ED Triage Vitals  Enc Vitals Group     BP 03/26/20 1231 137/69     Pulse Rate 03/26/20 1231 77     Resp 03/26/20 1231 18     Temp 03/26/20 1239 98 F (36.7 C)     Temp  Source 03/26/20 1239 Oral     SpO2 03/26/20 1231 98 %     Weight --      Height --      Head Circumference --      Peak Flow --      Pain Score 03/26/20 1239 10     Pain Loc --      Pain Edu? --      Excl. in [place]? --    No data found.  Updated Vital Signs BP 137/69 (BP Location: Left Arm)   Pulse 77   Temp 98 F (36.7 C) (Oral)   Resp 18   SpO2 98%   Visual Acuity Right Eye Distance:   Left Eye Distance:   Bilateral Distance:    Right Eye Near:   Left Eye Near:    Bilateral Near:     Physical Exam Vitals and nursing note reviewed.  Constitutional:      Appearance: She is well-developed.     Comments: No acute distress  HENT:     Head: Normocephalic and atraumatic.     Comments: Tenderness to palpation along sinuses maxillary and frontal, no obvious swelling or erythema to face Patient does have tenderness to palpation over parotid area on left without swelling    Ears:     Comments: Bilateral ears without tenderness to palpation of external auricle, tragus and mastoid, EAC's without erythema or swelling, TM's with good bony landmarks and cone of light. Non erythematous.     Nose: Nose normal.     Mouth/Throat:     Comments: 1.5 cm area of gingival swelling and erythema noted to left upper jaw with associated tenderness No soft palate swelling, nontender to palpation below tongue, posterior pharynx patent, uvula midline Eyes:     Conjunctiva/sclera: Conjunctivae normal.  Neck:     Comments: Full active range of motion of neck, no lymphadenopathy, tenderness to palpation in left tonsillar area Cardiovascular:     Rate and Rhythm: Normal rate.  Pulmonary:     Effort: Pulmonary effort is normal. No respiratory distress.  Abdominal:     General: There is no distension.  Musculoskeletal:        General: Normal range of motion.     Cervical back: Neck supple.  Skin:    General: Skin is warm and dry.  Neurological:     Mental Status: She is alert and oriented to  person, place, and time.      UC Treatments / Results  Labs (all labs ordered are  listed, but only abnormal results are displayed) Labs Reviewed - No data to display  EKG   Radiology No results found.  Procedures Procedures (including critical care time)  Medications Ordered in UC Medications  ketorolac (TORADOL) 30 MG/ML injection 30 mg (30 mg Intramuscular Given 03/26/20 1314)    Initial Impression / Assessment and Plan / UC Course  I have reviewed the triage vital signs and the nursing notes.  Pertinent labs & imaging results that were available during my care of the patient were reviewed by me and considered in my medical decision making (see chart for details).     Facial pain/swelling-concerning for more dental abscess given visible area of swelling on gingiva, initiating on clindamycin, Toradol prior to discharge, hydrocodone for severe pain.  Advised warm compresses and close monitoring.  If not having any improvement or worsening to follow-up in emergency room for further evaluation, may need IV antibiotics or CT scanning to rule out any other deep space infection.  Discussed strict return precautions. Patient verbalized understanding and is agreeable with plan.  Final Clinical Impressions(s) / UC Diagnoses   Final diagnoses:  Dental abscess  Facial swelling     Discharge Instructions     Begin clindamycin every 8 hours for infection Warm compresses to face Use anti-inflammatories for pain/swelling. You may take up to 800 mg Ibuprofen every 8 hours with food. You may supplement Ibuprofen with Tylenol [id number] mg every 8 hours.  Hydrocodone for severe pain If not improving or worsening, follow up in the emergency room   ED Prescriptions    Medication Sig Dispense Auth. Provider   clindamycin (CLEOCIN) 300 MG capsule Take 1 capsule (300 mg total) by mouth 3 (three) times daily for 7 days. 21 capsule Niajah Sipos C, PA-C   HYDROcodone-acetaminophen  (NORCO/VICODIN) 5-325 MG tablet Take 1-2 tablets by mouth every 6 (six) hours as needed for severe pain. 12 tablet Ishaaq Penna, [place] C, PA-C     I have reviewed the PDMP during this encounter.   Janith Lima, [place] 03/27/20 1733

## 2020-03-30 ENCOUNTER — Inpatient Hospital Stay: Payer: PRIVATE HEALTH INSURANCE

## 2020-03-30 ENCOUNTER — Other Ambulatory Visit: Payer: Self-pay

## 2020-03-30 VITALS — BP 101/59 | HR 81 | Temp 98.6°F | Resp 16

## 2020-03-30 DIAGNOSIS — D509 Iron deficiency anemia, unspecified: Secondary | ICD-10-CM | POA: Diagnosis not present

## 2020-03-30 MED ORDER — LORATADINE 10 MG PO TABS
10.0000 mg | ORAL_TABLET | Freq: Once | ORAL | Status: AC
  Administered 2020-03-30: 10 mg via ORAL

## 2020-03-30 MED ORDER — SODIUM CHLORIDE 0.9 % IV SOLN
Freq: Once | INTRAVENOUS | Status: AC
  Filled 2020-03-30: qty 250

## 2020-03-30 MED ORDER — ACETAMINOPHEN 325 MG PO TABS
650.0000 mg | ORAL_TABLET | Freq: Once | ORAL | Status: AC
  Administered 2020-03-30: 650 mg via ORAL

## 2020-03-30 MED ORDER — SODIUM CHLORIDE 0.9 % IV SOLN
750.0000 mg | Freq: Once | INTRAVENOUS | Status: AC
  Administered 2020-03-30: 750 mg via INTRAVENOUS
  Filled 2020-03-30: qty 15

## 2020-03-30 NOTE — Patient Instructions (Signed)

## 2020-03-31 ENCOUNTER — Other Ambulatory Visit: Payer: Self-pay | Admitting: *Deleted

## 2020-04-06 ENCOUNTER — Other Ambulatory Visit: Payer: Self-pay

## 2020-04-06 ENCOUNTER — Inpatient Hospital Stay: Payer: PRIVATE HEALTH INSURANCE | Attending: Hematology

## 2020-04-06 VITALS — BP 110/62 | HR 79 | Temp 98.1°F | Resp 16

## 2020-04-06 DIAGNOSIS — D509 Iron deficiency anemia, unspecified: Secondary | ICD-10-CM

## 2020-04-06 MED ORDER — ACETAMINOPHEN 325 MG PO TABS
ORAL_TABLET | ORAL | Status: AC
  Filled 2020-04-06: qty 2

## 2020-04-06 MED ORDER — ACETAMINOPHEN 325 MG PO TABS
650.0000 mg | ORAL_TABLET | Freq: Once | ORAL | Status: AC
  Administered 2020-04-06: 650 mg via ORAL

## 2020-04-06 MED ORDER — SODIUM CHLORIDE 0.9 % IV SOLN
Freq: Once | INTRAVENOUS | Status: AC
  Filled 2020-04-06: qty 250

## 2020-04-06 MED ORDER — SODIUM CHLORIDE 0.9 % IV SOLN
750.0000 mg | Freq: Once | INTRAVENOUS | Status: AC
  Administered 2020-04-06: 750 mg via INTRAVENOUS
  Filled 2020-04-06: qty 15

## 2020-04-06 MED ORDER — LORATADINE 10 MG PO TABS
10.0000 mg | ORAL_TABLET | Freq: Once | ORAL | Status: AC
  Administered 2020-04-06: 10 mg via ORAL

## 2020-04-06 MED ORDER — LORATADINE 10 MG PO TABS
ORAL_TABLET | ORAL | Status: AC
  Filled 2020-04-06: qty 1

## 2020-04-06 NOTE — Patient Instructions (Signed)

## 2020-04-09 ENCOUNTER — Telehealth: Payer: Self-pay | Admitting: *Deleted

## 2020-04-09 NOTE — Telephone Encounter (Signed)
Pt called with concerns of vaginal bleeding after iron infusion. Pt stated there was vaginal bleeding after iron infusion on 9/27 as well but very light. Pt stated she does have fibroids that was last checked in 2020, that have decreased in size, and pt still has menstrual cycle. Advised pt to contact OB/GYN to f/u with vaginal bleeding. Pt verbalized understanding.

## 2020-04-15 ENCOUNTER — Telehealth: Payer: Self-pay | Admitting: *Deleted

## 2020-04-15 DIAGNOSIS — D219 Benign neoplasm of connective and other soft tissue, unspecified: Secondary | ICD-10-CM

## 2020-04-15 NOTE — Telephone Encounter (Signed)
Order placed, Rosemarie Ax will patient to schedule.

## 2020-04-15 NOTE — Telephone Encounter (Signed)
Let's start with [facility] at time of Pelvic US.  Schedule Annual Gyn visit later.

## 2020-04-15 NOTE — Telephone Encounter (Signed)
Joanna Gross received a referral from [place] for patient to return to you for pelvic ultrasound for fibroids follow up. It appears you wanted her to have [facility] last year, but patient canceled. She is also over due for annual exam, do want her to have pelvic ultrasound or [facility] or schedule annual exam first with you? Please advise

## 2020-04-15 NOTE — Addendum Note (Signed)
Addended by: Thamas Jaegers on: 04/15/2020 02:53 PM   Modules accepted: Orders

## 2020-05-12 ENCOUNTER — Ambulatory Visit: Payer: PRIVATE HEALTH INSURANCE | Admitting: Obstetrics & Gynecology

## 2020-05-12 ENCOUNTER — Other Ambulatory Visit: Payer: PRIVATE HEALTH INSURANCE

## 2020-05-19 ENCOUNTER — Other Ambulatory Visit: Payer: Self-pay | Admitting: *Deleted

## 2020-05-19 ENCOUNTER — Ambulatory Visit (INDEPENDENT_AMBULATORY_CARE_PROVIDER_SITE_OTHER): Payer: PRIVATE HEALTH INSURANCE | Admitting: Obstetrics & Gynecology

## 2020-05-19 ENCOUNTER — Ambulatory Visit (INDEPENDENT_AMBULATORY_CARE_PROVIDER_SITE_OTHER): Payer: PRIVATE HEALTH INSURANCE

## 2020-05-19 ENCOUNTER — Other Ambulatory Visit: Payer: Self-pay | Admitting: Obstetrics & Gynecology

## 2020-05-19 ENCOUNTER — Other Ambulatory Visit: Payer: Self-pay

## 2020-05-19 DIAGNOSIS — D219 Benign neoplasm of connective and other soft tissue, unspecified: Secondary | ICD-10-CM

## 2020-05-19 DIAGNOSIS — D250 Submucous leiomyoma of uterus: Secondary | ICD-10-CM

## 2020-05-19 DIAGNOSIS — D500 Iron deficiency anemia secondary to blood loss (chronic): Secondary | ICD-10-CM | POA: Diagnosis not present

## 2020-05-19 DIAGNOSIS — N920 Excessive and frequent menstruation with regular cycle: Secondary | ICD-10-CM

## 2020-05-19 DIAGNOSIS — D509 Iron deficiency anemia, unspecified: Secondary | ICD-10-CM

## 2020-05-19 MED ORDER — NORETHINDRONE 0.35 MG PO TABS: 1.0000 | ORAL_TABLET | Freq: Every day | ORAL | 4 refills | Status: DC

## 2020-05-19 NOTE — Progress Notes (Signed)
Joanna Gross Apr 06, 1966 [id number]        54 y.o.  G1P0010  G1P0A1 Divorced.  Currently not in a relationship.  RP: Uterine Fibroids with pain, menorrhagia and secondary anemia for sonohysto  HPI:  Menstrual periods every month lasting 7 days.  Flow has been heavy all her life, but not worse recently.  Hb found to be 6.5 in 03/2018, Had a Blood Transfusion at that time.  Hb 10.9 on 07/17/2018.  No pelvic pain, but c/o lower back pain with Rt lateral leg pain x 2 weeks.  BMI 40.44, concerned about her recent weight gain.  TSH normal 07/17/2018.  OB History  Gravida Para Term Preterm AB Living  1 0     1 0  SAB TAB Ectopic Multiple Live Births               # Outcome Date GA Lbr Len/2nd Weight Sex Delivery Anes PTL Lv  1 AB             Past medical history,surgical history, problem list, medications, allergies, family history and social history were all reviewed and documented in the EPIC chart.   Directed ROS with pertinent positives and negatives documented in the history of present illness/assessment and plan.  Exam:  There were no vitals filed for this visit. General appearance:  Normal                                                                    Sono Infusion Hysterogram ( procedure note)   The initial transvaginal ultrasound demonstrated the following: T/A and T/V images.  Grossly enlarged uterus with multiple fibroids, largest are measured at 4.19, 3.58, 3.73, 3.16 and 5.17 cm. The overall uterine size is measured at 14.83 x 11.53 x 7.76 cm.  The endometrial cavity is distorted by surrounding fibroids.  The endometrial lining is measured at 8.51 mm.  Both ovaries are small with atrophic appearance.  No free fluid in the posterior cul-de-sac.  The speculum  was inserted and the cervix cleansed with Betadine solution after confirming that patient has no allergies.A small sonohysterography catheterwas utilized.  Insertion was facilitated with ring forceps, using a  spear-like motion the catheter was inserted to the fundus of the uterus. The speculum is then removed carefully to avoid dislodging the catheter. The catheter was flushed with sterile saline delete prior to insertion to rid it of small amounts of air.the sterile saline solution was infused into the uterine cavity as a vaginal ultrasound probe was then placed in the vagina for full visualization of the uterine cavity from a transvaginal approach. The following was noted: Saline infusion reveals a 4 cm submucosal fibroid as well as an adjacent 2.8 cm fibroid partially protruding into the uterine cavity.  The catheter was then removed after retrieving some of the saline from the intrauterine cavity. An endometrial biopsy was not done. Patient tolerated procedure well. She had received a tablet of Aleve for discomfort.    Assessment/Plan:  54 y.o. G1P0010   1. Menorrhagia with regular cycle Decision to start on the Progestin pill.  Counseling on management of Menorrhagia with multiple Fibroids including large SM Myomas and IM Myomas.  Recommend XI Robotic TLH or Supracervical Hysterectomy with Bilateral  Salpingectomy.  Patient is not ready for Hysterectomy.  Had 2 previous Myomectomies.  [place], vs [facility] and [facility] Myosure discussed.  Will start on the Progestin only pill now.  F/U in 3 months to reassess and decide on management.  Repeat CBC at that visit in 3 months.  2. Iron deficiency anemia due to chronic blood loss Iron transfusion received.  F/U Hemato tomorrow.  3. Fibroids SS, IM and SM Fibroids.  Other orders - norethindrone (MICRONOR) 0.35 MG tablet; Take 1 tablet (0.35 mg total) by mouth daily.  Princess Bruins MD, 5:21 PM 05/19/2020

## 2020-05-20 ENCOUNTER — Inpatient Hospital Stay: Payer: PRIVATE HEALTH INSURANCE

## 2020-05-20 ENCOUNTER — Inpatient Hospital Stay: Payer: PRIVATE HEALTH INSURANCE | Admitting: Hematology

## 2020-05-27 ENCOUNTER — Telehealth: Payer: Self-pay

## 2020-05-27 ENCOUNTER — Encounter: Payer: Self-pay | Admitting: Obstetrics & Gynecology

## 2020-05-27 NOTE — Telephone Encounter (Signed)
Received staff message from Dr. Dellis Filbert: "Please call patient to inform that she would be a good candidate for the new instrument Sonata to remove Fibroids with [place] energy through intrauterine access. Please schedule a Preop visit with me within the next 2 weeks to discuss and organize if patient is interested."  I called patient and relayed this information and she is interested. I transferred her to the appointment desk to schedule a visit to come and discuss on 06/09/20.

## 2020-06-04 ENCOUNTER — Other Ambulatory Visit: Payer: Self-pay

## 2020-06-04 ENCOUNTER — Inpatient Hospital Stay (HOSPITAL_BASED_OUTPATIENT_CLINIC_OR_DEPARTMENT_OTHER): Payer: PRIVATE HEALTH INSURANCE | Admitting: Hematology

## 2020-06-04 ENCOUNTER — Inpatient Hospital Stay: Payer: PRIVATE HEALTH INSURANCE | Attending: Hematology

## 2020-06-04 VITALS — BP 123/59 | HR 72 | Temp 97.6°F | Resp 18 | Ht 60.5 in | Wt 236.5 lb

## 2020-06-04 DIAGNOSIS — D509 Iron deficiency anemia, unspecified: Secondary | ICD-10-CM | POA: Insufficient documentation

## 2020-06-04 LAB — CBC WITH DIFFERENTIAL (CANCER CENTER ONLY)
Abs Immature Granulocytes: 0.01 10*3/uL (ref 0.00–0.07)
Basophils Absolute: 0 10*3/uL (ref 0.0–0.1)
Basophils Relative: 1 %
Eosinophils Absolute: 0 10*3/uL (ref 0.0–0.5)
Eosinophils Relative: 1 %
HCT: 34.4 % — ABNORMAL LOW (ref 36.0–46.0)
Hemoglobin: 10.7 g/dL — ABNORMAL LOW (ref 12.0–15.0)
Immature Granulocytes: 0 %
Lymphocytes Relative: 33 %
Lymphs Abs: 1.3 10*3/uL (ref 0.7–4.0)
MCH: 27.1 pg (ref 26.0–34.0)
MCHC: 31.1 g/dL (ref 30.0–36.0)
MCV: 87.1 fL (ref 80.0–100.0)
Monocytes Absolute: 0.3 10*3/uL (ref 0.1–1.0)
Monocytes Relative: 7 %
Neutro Abs: 2.4 10*3/uL (ref 1.7–7.7)
Neutrophils Relative %: 58 %
Platelet Count: 249 10*3/uL (ref 150–400)
RBC: 3.95 MIL/uL (ref 3.87–5.11)
RDW: 20.7 % — ABNORMAL HIGH (ref 11.5–15.5)
WBC Count: 4.1 10*3/uL (ref 4.0–10.5)
nRBC: 0 % (ref 0.0–0.2)

## 2020-06-04 LAB — CMP (CANCER CENTER ONLY)
ALT: 35 U/L (ref 0–44)
AST: 24 U/L (ref 15–41)
Albumin: 4.1 g/dL (ref 3.5–5.0)
Alkaline Phosphatase: 89 U/L (ref 38–126)
Anion gap: 9 (ref 5–15)
BUN: 11 mg/dL (ref 6–20)
CO2: 24 mmol/L (ref 22–32)
Calcium: 10.2 mg/dL (ref 8.9–10.3)
Chloride: 106 mmol/L (ref 98–111)
Creatinine: 0.72 mg/dL (ref 0.44–1.00)
GFR, Estimated: >60 mL/min (ref >60)
Glucose, Bld: 91 mg/dL (ref 70–99)
Potassium: 4 mmol/L (ref 3.5–5.1)
Sodium: 139 mmol/L (ref 135–145)
Total Bilirubin: 0.3 mg/dL (ref 0.3–1.2)
Total Protein: 8.1 g/dL (ref 6.5–8.1)

## 2020-06-04 LAB — IRON AND TIBC
Iron: 40 ug/dL — ABNORMAL LOW (ref 41–142)
Saturation Ratios: 10 % — ABNORMAL LOW (ref 21–57)
TIBC: 386 ug/dL (ref 236–444)
UIBC: 346 ug/dL (ref 120–384)

## 2020-06-04 LAB — FERRITIN: Ferritin: 15 ng/mL (ref 11–307)

## 2020-06-04 NOTE — Progress Notes (Signed)
HEMATOLOGY/ONCOLOGY CONSULTATION NOTE  Date of Service: 06/04/2020  Patient Care Team: Hayden Rasmussen, MD as PCP - General (Family Medicine)  CHIEF COMPLAINTS/PURPOSE OF CONSULTATION:  Severe anemia  HISTORY OF PRESENTING ILLNESS:  Joanna Gross is a wonderful 54 y.o. female who has been referred to [place] by Dr. Darron Doom for evaluation and management of severe anemia. Pt is accompanied today by her mother. The pt reports that she is doing well overall.   The pt reports that her menstrual cycles last 10-12 days, with 2-3 heavy days. Pt has a history of Fibroids and two myomectomies. She currently has a fibroid. She has never used oral birth control. Pt has used Progesterone once to halt her period. Pt is currently following with an OBGYN.   Pt has a long history of anemia and required two units of blood in 2019. She has never been given IV Iron. She was given PO Iron, but was unable to continue taking it as it was causing constipation. Pt has been taking prenatal iron since 2019 and was switched to Fusion Plus iron on Monday. Pt has not had a Colonoscopy since turning 52 and reports regular bowel movements. She has significant ice cravings.  She currently has a sinus infection and began taking Doxycycline on Monday. Pt has lost 15 lbs in the last month with effort. She has recently developed new allergies. She has a history of Vitamin D deficiency. 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 Pt has no family history of blood disorders. Pt has had ankle swelling and restless legs at night recently.   Most recent lab results (03/03/2020) of CBC is as follows: all values are WNL except for Hgb at 6.6, MCV at 69. 03/03/2020 Ferritin at 4, Iron Sat 3.  05/18/2019 Vitamin D 25 (OH) at 16.7 05/18/2019 Ferritin at 13.2  On review of systems, pt reports palpitations, headaches, SOB, dizziness, lightheadedness, left forearm numbness, muscle cramping, numbness/tingling in legs and  denies bloody/black stools, bowel habit changes, fevers, abdominal pain, unexpected weight loss and any other symptoms.   On PMHx the pt reports Anemia, Heart murmur, Vitamin D deficiency, Fibroids, Myomectomy x2.  INTERVAL HISTORY: Joanna Gross is a wonderful 54 y.o. female who is here for evaluation and management of severe anemia. The patient's last visit with [place] was on 03/25/2020. The pt reports that she is doing well overall.  The pt reports that she began to feel an increase in energy about two weeks ago. Pt has a follow up with the Gynecologist on 06/09/20 who is working with the patient to manage her menorrhagia. Her first menstrual cycle after the IV Iron infusions was slightly heavier than normal, but her latest menstrual period was noticeably lighter after she had a sonohysterogram. She has received the COVID19 booster.   Lab results today (06/04/20) of CBC w/diff and CMP is as follows: all values are WNL except for Hgb at 10.7, HCT at 34.4, RDW at 20.7. 06/04/2020 Ferritin at 15 06/04/2020 Vitamin B1 is in progress 06/04/2020 Folate RBC is in progress 06/04/2020 Iron Panel is as follows: Iron at 40, TIBC at 386, Sat Ratios at 10, UIBC at 346  On review of systems, pt reports ankle swelling, improved muscle cramps and denies fatigue, ice cravings, mouth sores, dental pain, abdominal pain and any other symptoms.   MEDICAL HISTORY:  Past Medical History:  Diagnosis Date  . Anemia   . Heart murmur   . Seizures ([place])  1975   1 episode.  No meds since 1988    SURGICAL HISTORY: Past Surgical History:  Procedure Laterality Date  . BREAST SURGERY  nov. 2010   fibroid removed from right breast (3x)  . BREAST SURGERY  1989   fibroid removed from left breast  . BUNIONECTOMY  1999  . CYSTECTOMY  1974   left side of neck   . UTERINE FIBROID SURGERY  october 2003    SOCIAL HISTORY: Social History   Socioeconomic History  . Marital status: Divorced    Spouse name: n/a  .  Number of children: 0  . Years of education: Not on file  . Highest education level: Not on file  Occupational History  . Occupation: [employer]    Comment: self-employed  Tobacco Use  . Smoking status: Never Smoker  . Smokeless tobacco: Never Used  Vaping Use  . Vaping Use: Never used  Substance and Sexual Activity  . Alcohol use: Yes    Alcohol/week: 0.0 - 1.0 standard drinks    Comment: RARE  . Drug use: No  . Sexual activity: Not Currently    Partners: Male    Birth control/protection: Condom    Comment: 1st intercourse- 14, partners- 38, divorced   Other Topics Concern  . Not on file  Social History Narrative  . Not on file   Social Determinants of Health   Financial Resource Strain:   . Difficulty of Paying Living Expenses: Not on file  Food Insecurity:   . Worried About Charity fundraiser in the Last Year: Not on file  . Ran Out of Food in the Last Year: Not on file  Transportation Needs:   . Lack of Transportation (Medical): Not on file  . Lack of Transportation (Non-Medical): Not on file  Physical Activity:   . Days of Exercise per Week: Not on file  . Minutes of Exercise per Session: Not on file  Stress:   . Feeling of Stress : Not on file  Social Connections:   . Frequency of Communication with Friends and Family: Not on file  . Frequency of Social Gatherings with Friends and Family: Not on file  . Attends Religious Services: Not on file  . Active Member of Clubs or Organizations: Not on file  . Attends Archivist Meetings: Not on file  . Marital Status: Not on file  Intimate Partner Violence:   . Fear of Current or Ex-Partner: Not on file  . Emotionally Abused: Not on file  . Physically Abused: Not on file  . Sexually Abused: Not on file    FAMILY HISTORY: Family History  Problem Relation Age of Onset  . Arthritis Mother   . Hypertension Father   . Diabetes Father     ALLERGIES:  is allergic to amoxicillin, furosemide, penicillins,  bee venom, erythromycin, and eucalyptus flavor [flavoring agent].  MEDICATIONS:  Current Outpatient Medications  Medication Sig Dispense Refill  . acetaminophen-codeine (TYLENOL #3) 300-30 MG tablet Take 1 tablet by mouth every 6 (six) hours as needed for moderate pain.    Marland Kitchen doxycycline (VIBRAMYCIN) 100 MG capsule Take 100 mg by mouth daily.    . Fe Fum-Fe Poly-Vit C-Lactobac (FUSION PO) Take by mouth.    . Fluticasone Propionate (FLONASE ALLERGY RELIEF NA)     . HYDROcodone-acetaminophen (NORCO/VICODIN) 5-325 MG tablet Take 1-2 tablets by mouth every 6 (six) hours as needed for severe pain. 12 tablet 0  . ibuprofen (ADVIL) 800 MG tablet Take 800  mg by mouth every 8 (eight) hours as needed.    . Iron-FA-B Cmp-C-Biot-Probiotic (FUSION PLUS PO)     . norethindrone (MICRONOR) 0.35 MG tablet Take 1 tablet (0.35 mg total) by mouth daily. (Patient not taking: Reported on 06/04/2020) 84 tablet 4   No current facility-administered medications for this visit.    REVIEW OF SYSTEMS:   A 10+ POINT REVIEW OF SYSTEMS WAS OBTAINED including neurology, dermatology, psychiatry, cardiac, respiratory, lymph, extremities, GI, GU, Musculoskeletal, constitutional, breasts, reproductive, HEENT.  All pertinent positives are noted in the HPI.  All others are negative.   PHYSICAL EXAMINATION: ECOG PERFORMANCE STATUS: 1 - Symptomatic but completely ambulatory  . Vitals:   06/04/20 1407  BP: (!) 123/59  Pulse: 72  Resp: 18  Temp: 97.6 F (36.4 C)  SpO2: 99%   Filed Weights   06/04/20 1407  Weight: 236 lb 8 oz (107.3 kg)   .Body mass index is 45.43 kg/m.   GENERAL:alert, in no acute distress and comfortable SKIN: no acute rashes, no significant lesions EYES: conjunctiva are pink and non-injected, sclera anicteric OROPHARYNX: MMM, no exudates, no oropharyngeal erythema or ulceration NECK: supple, no JVD LYMPH:  no palpable lymphadenopathy in the cervical, axillary or inguinal regions LUNGS: clear to  auscultation b/l with normal respiratory effort HEART: regular rate & rhythm ABDOMEN:  normoactive bowel sounds , non tender, not distended. No palpable hepatosplenomegaly.  Extremity: no pedal edema PSYCH: alert & oriented x 3 with fluent speech NEURO: no focal motor/sensory deficits  LABORATORY DATA:  I have reviewed the data as listed  . CBC Latest Ref Rng & Units 06/04/2020 03/25/2020 03/25/2020  WBC 4.0 - 10.5 K/uL 4.1 5.9 -  Hemoglobin 12.0 - 15.0 g/dL 10.7(L) 7.1(L) -  Hematocrit 36 - 46 % 34.4(L) 26.3(L) 25.8(L)  Platelets 150 - 400 K/uL 249 290 -   . CBC    Component Value Date/Time   WBC 4.1 06/04/2020 1343   WBC 5.9 03/25/2020 1409   RBC 3.95 06/04/2020 1343   HGB 10.7 (L) 06/04/2020 1343   HCT 34.4 (L) 06/04/2020 1343   HCT 25.8 (L) 03/25/2020 1409   PLT 249 06/04/2020 1343   MCV 87.1 06/04/2020 1343   MCV 79.1 (A) 08/06/2013 1534   MCH 27.1 06/04/2020 1343   MCHC 31.1 06/04/2020 1343   RDW 20.7 (H) 06/04/2020 1343   LYMPHSABS 1.3 06/04/2020 1343   MONOABS 0.3 06/04/2020 1343   EOSABS 0.0 06/04/2020 1343   BASOSABS 0.0 06/04/2020 1343    . CMP Latest Ref Rng & Units 06/04/2020 03/25/2020 03/26/2018  Glucose 70 - 99 mg/dL 91 110(H) 97  BUN 6 - 20 mg/dL 11 6 9   Creatinine 0.44 - 1.00 mg/dL 0.72 0.72 0.74  Sodium 135 - 145 mmol/L 139 137 137  Potassium 3.5 - 5.1 mmol/L 4.0 3.9 4.2  Chloride 98 - 111 mmol/L 106 106 103  CO2 22 - 32 mmol/L 24 26 26   Calcium 8.9 - 10.3 mg/dL 10.2 9.8 9.6  Total Protein 6.5 - 8.1 g/dL 8.1 7.9 8.1  Total Bilirubin 0.3 - 1.2 mg/dL 0.3 0.4 0.8  Alkaline Phos 38 - 126 U/L 89 78 56  AST 15 - 41 U/L 24 18 37  ALT 0 - 44 U/L 35 17 18   . Lab Results  Component Value Date   IRON 40 (L) 06/04/2020   TIBC 386 06/04/2020   IRONPCTSAT 10 (L) 06/04/2020   (Iron and TIBC)  Lab Results  Component Value Date  FERRITIN 15 06/04/2020     RADIOGRAPHIC STUDIES: I have personally reviewed the radiological images as listed and agreed  with the findings in the report. No results found.  ASSESSMENT & PLAN:   54 yo with   1) Severe iron deficiency with microcytic severe anemia PLAN: -Discussed pt labwork today, 06/04/20; no anemia, no microcytosis, PLT & WBC are nml, Ferritin & Sat Ratios remain low, Vitamin B1 & Folic acid are in progress -Hgb >7 today - no indication for a blood transfusion at this time. -Advised pt that although her Hgb has improved significantly she will need additional IV Iron to increase iron stores. Will order 1 additional dose of Injectafer 750mg  x 1 -Recommend pt hydrate well, wear compression socks, and ambulate every hour when completing long-distance travel. -Recommend pt continue f/u with OBGYN for menorrhagia management. -Continue OTC B-complex daily.  -Will see back in 4 months with labs    FOLLOW UP: RTC with Dr Irene Limbo with labs in 4 months   The total time spent in the appt was 20 minutes and more than 50% was on counseling and direct patient cares.  All of the patient's questions were answered with apparent satisfaction. The patient knows to call the clinic with any problems, questions or concerns.    Sullivan Lone MD [place] AAHIVMS [facility] Hematology/Oncology Physician [facility]  (Office):       [phone] (Work cell):  [phone] (Fax):           [phone]  06/04/2020 5:01 PM  I, Yevette Edwards, am acting as a scribe for Dr. Sullivan Lone.   .I have reviewed the above documentation for accuracy and completeness, and I agree with the above. Brunetta Genera MD

## 2020-06-05 LAB — SAMPLE TO BLOOD BANK

## 2020-06-05 LAB — FOLATE RBC
Folate, Hemolysate: 580 ng/mL
Folate, RBC: 1611 ng/mL (ref >498)
Hematocrit: 36 % (ref 34.0–46.6)

## 2020-06-09 ENCOUNTER — Other Ambulatory Visit: Payer: Self-pay

## 2020-06-09 ENCOUNTER — Encounter: Payer: Self-pay | Admitting: Obstetrics & Gynecology

## 2020-06-09 ENCOUNTER — Ambulatory Visit: Payer: PRIVATE HEALTH INSURANCE | Admitting: Obstetrics & Gynecology

## 2020-06-09 ENCOUNTER — Ambulatory Visit (INDEPENDENT_AMBULATORY_CARE_PROVIDER_SITE_OTHER): Payer: PRIVATE HEALTH INSURANCE | Admitting: Obstetrics & Gynecology

## 2020-06-09 VITALS — BP 128/82

## 2020-06-09 DIAGNOSIS — N920 Excessive and frequent menstruation with regular cycle: Secondary | ICD-10-CM | POA: Diagnosis not present

## 2020-06-09 DIAGNOSIS — D500 Iron deficiency anemia secondary to blood loss (chronic): Secondary | ICD-10-CM | POA: Diagnosis not present

## 2020-06-09 DIAGNOSIS — D219 Benign neoplasm of connective and other soft tissue, unspecified: Secondary | ICD-10-CM

## 2020-06-09 LAB — VITAMIN B1: Vitamin B1 (Thiamine): 68.7 nmol/L (ref 66.5–200.0)

## 2020-06-09 MED ORDER — NORETHINDRONE 0.35 MG PO TABS: 1.0000 | ORAL_TABLET | Freq: Every day | ORAL | 4 refills | Status: DC

## 2020-06-09 NOTE — Progress Notes (Signed)
Joanna Gross 1965/10/13 [id number]        54 y.o. G1P0A1 Divorced. Currently not in a relationship.  RP: Uterine Fibroids with pain, menorrhagia and secondary anemia to discuss management  NKN:[id number] didn't start on the Progestin pill at last visit, no change in symptoms.  Received IV Iron x 1.  Hb went from 7.1 in 03/2020 to 10.7 on 06/04/20. Last visit on 05/19/2020 we noted:  Menstrual periods every month lasting 7 days. Flow has been heavy all her life, but not worse recently. Hb found to be 6.5 in 03/2018, Had a Blood Transfusion at that time. Hb 10.9 on 07/17/2018. No pelvic pain, but c/o lower back pain with Rt lateral leg pain x 2 weeks. BMI 40.44, concerned about her recent weight gain. TSH normal 07/17/2018.   OB History  Gravida Para Term Preterm AB Living  1 0     1 0  SAB TAB Ectopic Multiple Live Births               # Outcome Date GA Lbr Len/2nd Weight Sex Delivery Anes PTL Lv  1 AB             Past medical history,surgical history, problem list, medications, allergies, family history and social history were all reviewed and documented in the EPIC chart.   Directed ROS with pertinent positives and negatives documented in the history of present illness/assessment and plan.  Exam:  Vitals:   06/09/20 1218  BP: 128/82   General appearance:  Normal   Sono Infusion Hysterogram ( procedure note) on 05/19/2020:  The initial transvaginal ultrasound demonstrated the following: T/A and T/V images.  Grossly enlarged uterus with multiple fibroids, largest are measured at 4.19, 3.58, 3.73, 3.16 and 5.17 cm. The overall uterine size is measured at 14.83 x 11.53 x 7.76 cm.  The endometrial cavity is distorted by surrounding fibroids.  The endometrial lining is measured at 8.51 mm.  Both ovaries are small with atrophic appearance.  No free fluid in the posterior cul-de-sac.  The speculum  was inserted and the cervix cleansed with Betadine solution after  confirming that patient has no allergies.A small sonohysterography catheterwas utilized.  Insertion was facilitated with ring forceps, using a spear-like motion the catheter was inserted to the fundus of the uterus. The speculum is then removed carefully to avoid dislodging the catheter. The catheter was flushed with sterile saline delete prior to insertion to rid it of small amounts of air.the sterile saline solution was infused into the uterine cavity as a vaginal ultrasound probe was then placed in the vagina for full visualization of the uterine cavity from a transvaginal approach. The following was noted: Saline infusion reveals a 4 cm submucosal fibroid as well as an adjacent 2.8 cm fibroid partially protruding into the uterine cavity.  The catheter was then removed after retrieving some of the saline from the intrauterine cavity. An endometrial biopsy was not done. Patient tolerated procedure well. She had received a tablet of Aleve for discomfort.    Assessment/Plan:  54 y.o. G1P0010   1. Fibroids Many Fibroids, the largest at 5.17 cm with IM and SM Fibroids.  Menorrhagia with secondary anemia.  The new surgical option with Sonata discussed with patient thoroughly..  Will try the Progestin pill first.and will reevaluate at Annual/Gyn visit with a repeat CBC in 08/2020.   2. Menorrhagia with regular cycle Start on the Progestin pill.  Benefits/risks and usage reviewed.  Prescription sent to pharmacy. - CBC;  Future  3. Iron deficiency anemia due to chronic blood loss Hb improved to 10 - CBC; Future  Other orders - cholecalciferol (VITAMIN D3) 25 MCG (1000 UNIT) tablet; Take 1,000 Units by mouth daily. - Omega-3 Fatty Acids (FISH OIL) 1000 MG CAPS; Take by mouth. - co-enzyme Q-10 30 MG capsule; Take 30 mg by mouth 3 (three) times daily. - ELDERBERRY PO; Take by mouth. - lactobacillus acidophilus (BACID) TABS tablet; Take 2 tablets by mouth 3 (three) times daily. - Cobalamin  Combinations (LIVER-B-12 PO); Take by mouth. - norethindrone (MICRONOR) 0.35 MG tablet; Take 1 tablet (0.35 mg total) by mouth daily.  Princess Bruins MD, 12:40 PM 06/09/2020

## 2020-06-10 ENCOUNTER — Encounter: Payer: Self-pay | Admitting: Obstetrics & Gynecology

## 2020-06-11 ENCOUNTER — Telehealth: Payer: Self-pay | Admitting: *Deleted

## 2020-06-11 NOTE — Telephone Encounter (Signed)
Contacted patient regarding test results per Dr. Grier Mitts directions in previous message. Patient is agreeable to additional dose of IV injectafer. Schedule message sent.

## 2020-06-11 NOTE — Telephone Encounter (Signed)
-----   Message from Brunetta Genera, MD sent at 06/10/2020 11:09 PM EST ----- plz let patient know her iron levels are still low and we weould recommend 1 additional dose of IV Injectafer... plz schedule if agreeable. thx GK

## 2020-06-19 ENCOUNTER — Other Ambulatory Visit: Payer: Self-pay

## 2020-06-19 ENCOUNTER — Inpatient Hospital Stay: Payer: PRIVATE HEALTH INSURANCE

## 2020-06-19 VITALS — BP 107/59 | HR 84 | Temp 98.4°F | Resp 18

## 2020-06-19 DIAGNOSIS — D509 Iron deficiency anemia, unspecified: Secondary | ICD-10-CM

## 2020-06-19 MED ORDER — LORATADINE 10 MG PO TABS
10.0000 mg | ORAL_TABLET | Freq: Every day | ORAL | Status: DC
  Administered 2020-06-19: 09:00:00 10 mg via ORAL

## 2020-06-19 MED ORDER — ACETAMINOPHEN 325 MG PO TABS
ORAL_TABLET | ORAL | Status: AC
  Filled 2020-06-19: qty 2

## 2020-06-19 MED ORDER — SODIUM CHLORIDE 0.9 % IV SOLN
750.0000 mg | Freq: Once | INTRAVENOUS | Status: AC
  Administered 2020-06-19: 10:00:00 750 mg via INTRAVENOUS
  Filled 2020-06-19: qty 15

## 2020-06-19 MED ORDER — LORATADINE 10 MG PO TABS
ORAL_TABLET | ORAL | Status: AC
  Filled 2020-06-19: qty 1

## 2020-06-19 MED ORDER — SODIUM CHLORIDE 0.9 % IV SOLN
Freq: Once | INTRAVENOUS | Status: AC
  Filled 2020-06-19: qty 250

## 2020-06-19 MED ORDER — ACETAMINOPHEN 325 MG PO TABS
650.0000 mg | ORAL_TABLET | Freq: Once | ORAL | Status: AC
  Administered 2020-06-19: 09:00:00 650 mg via ORAL

## 2020-06-19 NOTE — Patient Instructions (Signed)

## 2020-07-17 ENCOUNTER — Telehealth: Payer: Self-pay | Admitting: *Deleted

## 2020-07-17 MED ORDER — NORETHINDRONE 0.35 MG PO TABS: 1.0000 | ORAL_TABLET | Freq: Every day | ORAL | 4 refills | Status: DC

## 2020-07-17 NOTE — Telephone Encounter (Signed)
Patient called stating [facility] did not have her Rx for Micronor 0.35 mg tablet, from office visit on 06/09/20. Rx sent sent.

## 2020-07-31 ENCOUNTER — Telehealth: Payer: Self-pay | Admitting: *Deleted

## 2020-07-31 NOTE — Telephone Encounter (Signed)
Patient called started Micronor 0.35 mg tablet on 07/21/20 tablet and noticed sleep issues, and weird stomach feeling per patient. Reports her bleeding has gotten lighter, I told patient when looking this information up it did say some of her complaints are common side effects. Patient said she will try to take the pill later and see if this helps with sleep. She will follow up as needed, annual exam scheduled on 08/18/20

## 2020-08-18 ENCOUNTER — Encounter: Payer: Self-pay | Admitting: Obstetrics & Gynecology

## 2020-08-18 ENCOUNTER — Other Ambulatory Visit: Payer: Self-pay

## 2020-08-18 ENCOUNTER — Ambulatory Visit (INDEPENDENT_AMBULATORY_CARE_PROVIDER_SITE_OTHER): Payer: PRIVATE HEALTH INSURANCE | Admitting: Obstetrics & Gynecology

## 2020-08-18 ENCOUNTER — Encounter: Payer: Self-pay | Admitting: *Deleted

## 2020-08-18 VITALS — BP 116/74 | Ht 65.0 in | Wt 243.0 lb

## 2020-08-18 DIAGNOSIS — Z3041 Encounter for surveillance of contraceptive pills: Secondary | ICD-10-CM

## 2020-08-18 DIAGNOSIS — D250 Submucous leiomyoma of uterus: Secondary | ICD-10-CM | POA: Diagnosis not present

## 2020-08-18 DIAGNOSIS — N920 Excessive and frequent menstruation with regular cycle: Secondary | ICD-10-CM

## 2020-08-18 DIAGNOSIS — D500 Iron deficiency anemia secondary to blood loss (chronic): Secondary | ICD-10-CM

## 2020-08-18 DIAGNOSIS — Z01419 Encounter for gynecological examination (general) (routine) without abnormal findings: Secondary | ICD-10-CM | POA: Diagnosis not present

## 2020-08-18 DIAGNOSIS — D219 Benign neoplasm of connective and other soft tissue, unspecified: Secondary | ICD-10-CM | POA: Diagnosis not present

## 2020-08-18 NOTE — Progress Notes (Signed)
Joanna Gross 1966/03/18 [id number]   History:    55 y.o. G1P0A1 Divorced.  Currently not in a relationship.  RP:  Established patient presenting for annual gyn exam and Uterine Fibroids with pain and secondary anemia  HPI:  Well on the Progestin-pill with lighter periods, but frequent BTB.  Sonohysto showed 2 SM Myomas in 05/2020.  H/O anemia with blood transfusion/Iron IV, followed by Hemato.  No pelvic pain.  Breasts normal.  BMI 40.44, same as last year.  Health labs with Dr Darron Doom.  Needs to schedule a Colonoscopy.   Past medical history,surgical history, family history and social history were all reviewed and documented in the EPIC chart.  Gynecologic History Patient's last menstrual period was 08/10/2020.  Obstetric History OB History  Gravida Para Term Preterm AB Living  1 0     1 0  SAB IAB Ectopic Multiple Live Births               # Outcome Date GA Lbr Len/2nd Weight Sex Delivery Anes PTL Lv  1 AB              ROS: A ROS was performed and pertinent positives and negatives are included in the history.  GENERAL: No fevers or chills. HEENT: No change in vision, no earache, sore throat or sinus congestion. NECK: No pain or stiffness. CARDIOVASCULAR: No chest pain or pressure. No palpitations. PULMONARY: No shortness of breath, cough or wheeze. GASTROINTESTINAL: No abdominal pain, nausea, vomiting or diarrhea, melena or bright red blood per rectum. GENITOURINARY: No urinary frequency, urgency, hesitancy or dysuria. MUSCULOSKELETAL: No joint or muscle pain, no back pain, no recent trauma. DERMATOLOGIC: No rash, no itching, no lesions. ENDOCRINE: No polyuria, polydipsia, no heat or cold intolerance. No recent change in weight. HEMATOLOGICAL: No anemia or easy bruising or bleeding. NEUROLOGIC: No headache, seizures, numbness, tingling or weakness. PSYCHIATRIC: No depression, no loss of interest in normal activity or change in sleep pattern.     Exam:   Ht 5\' 5"  (1.651  m)   Wt 243 lb (110.2 kg)   LMP 08/10/2020 Comment: PILL  BMI 40.44 kg/m   Body mass index is 40.44 kg/m.  General appearance : Well developed well nourished female. No acute distress HEENT: Eyes: no retinal hemorrhage or exudates,  Neck supple, trachea midline, no carotid bruits, no thyroidmegaly Lungs: Clear to auscultation, no rhonchi or wheezes, or rib retractions  Heart: Regular rate and rhythm, no murmurs or gallops Breast:Examined in sitting and supine position were symmetrical in appearance, no palpable masses or tenderness,  no skin retraction, no nipple inversion, no nipple discharge, no skin discoloration, no axillary or supraclavicular lymphadenopathy Abdomen: no palpable masses or tenderness, no rebound or guarding Extremities: no edema or skin discoloration or tenderness  Pelvic: Vulva: Normal             Vagina: No gross lesions or discharge  Cervix: No gross lesions or discharge.  Pap reflex done.  Uterus  AV, nodular, about 14 cm, mobile, non-tender  Adnexa  Without masses or tenderness  Anus: Normal  Sono Infusion Hysterogram ( procedure note)   The initial transvaginal ultrasound demonstrated the following: T/A and T/V images.  Grossly enlarged uterus with multiple fibroids, largest are measured at 4.19, 3.58, 3.73, 3.16 and 5.17 cm. The overall uterine size is measured at 14.83 x 11.53 x 7.76 cm.  The endometrial cavity is distorted by surrounding fibroids.  The endometrial lining is measured at 8.51 mm.  Both ovaries are small with atrophic appearance.  No free fluid in the posterior cul-de-sac.  The speculum  was inserted and the cervix cleansed with Betadine solution after confirming that patient has no allergies.A small sonohysterography catheterwas utilized.  Insertion was facilitated with ring forceps, using a spear-like motion the catheter was inserted to the fundus of the uterus. The speculum is then removed carefully to avoid dislodging the catheter. The  catheter was flushed with sterile saline delete prior to insertion to rid it of small amounts of air.the sterile saline solution was infused into the uterine cavity as a vaginal ultrasound probe was then placed in the vagina for full visualization of the uterine cavity from a transvaginal approach. The following was noted: Saline infusion reveals a 4 cm submucosal fibroid as well as an adjacent 2.8 cm fibroid partially protruding into the uterine cavity.  The catheter was then removed after retrieving some of the saline from the intrauterine cavity. An endometrial biopsy was not done. Patient tolerated procedure well.   Assessment/Plan:  55 y.o. female for annual exam   1. Encounter for routine gynecological examination with Papanicolaou smear of cervix Gynecologic exam with enlarged nodular uterus/known fibroids.  Pap reflex done.  Breasts normal.  Screening mammo to schedule now, overdue.  Health labs with Fam MD.  Refer to [facility] for [place].  2. Encounter for surveillance of contraceptive pills Continue on Micronor to control Menorrhagia until surgery.  3. Fibroids Many Fibroids including 2 partially SM Fibroids.  Counseling done on management options.  Decision to proceed with Sonata.  HSC/D+C/Sonata procedure/Possible Myosure excision.  Info and pamphlet given.  F/U Preop visit.  4. Submucous uterine fibroid As above.  5. Menorrhagia with regular cycle H/O severe secondary anemia.  Will repeat a CBC preop.  6. Iron deficiency anemia due to chronic blood loss Last Hb 10.7 in 06/2020.  Will repeat CBC preop.  Princess Bruins MD, 12:00 PM 08/18/2020

## 2020-08-19 ENCOUNTER — Telehealth: Payer: Self-pay

## 2020-08-19 LAB — PAP IG W/ RFLX HPV ASCU

## 2020-08-19 NOTE — Telephone Encounter (Signed)
I called patient and discussed the need for her to complete medical records release form for Sonata support rep to assist with PA with her ins co.  Form was emailed to patient and she will email it back.

## 2020-08-25 ENCOUNTER — Encounter: Payer: Self-pay | Admitting: Obstetrics & Gynecology

## 2020-08-26 ENCOUNTER — Telehealth: Payer: Self-pay

## 2020-08-26 NOTE — Telephone Encounter (Signed)
Spoke with patient regarding surgery benefits. Patient acknowledges understanding of information presented. Patient is aware that benefits presented are professional benefits only. Patient is aware that once surgery is scheduled, the hospital will call with separate benefits. See account note.  Informed patient Read Drivers authorization has been submitted and pending approval.  Routing to Kristine Linea, RMA, for surgery scheduling.

## 2020-09-01 NOTE — Telephone Encounter (Signed)
I spoke with patient and scheduled her surgery for 10/06/20 at 8:30am at [facility]. (Case [id number])  Coordinated with Eddie Dibbles Russo/Sonata and confirmed date.  Covid test was scheduled accordingly and patient was advised of quarantine protocol following the test.  Handout and site map mailed to patient.  Pre op visit scheduled 10/01/20 at noon with Dr. Marguerita Merles.

## 2020-09-07 ENCOUNTER — Encounter: Payer: Self-pay | Admitting: Hematology

## 2020-09-08 ENCOUNTER — Telehealth: Payer: Self-pay | Admitting: Hematology

## 2020-09-08 ENCOUNTER — Other Ambulatory Visit: Payer: Self-pay | Admitting: Hematology

## 2020-09-08 ENCOUNTER — Telehealth: Payer: Self-pay | Admitting: *Deleted

## 2020-09-08 NOTE — Telephone Encounter (Signed)
Scheduled infusion appointments per 3/8 schedule message. Patient is aware.

## 2020-09-08 NOTE — Telephone Encounter (Signed)
Patient's PCP faxed most recent labs (09/04/20) to Dr.Kale's office. Ferritin 5.5 and Hgb 9.8. Patient is scheduled for surgical procedure on 10/06/20. Dr.Kale informed. Per Dr. Irene Limbo - ordered placed for IV Injectafer weekly x 2 doses. Send scheduling msg to schedule ASAP. Has previously received Injectafer.   Contacted patient with information as per Dr. Irene Limbo. She verbalized understanding of MD plan and is in agreement to receive IV iron. Schedule message sent

## 2020-09-15 ENCOUNTER — Inpatient Hospital Stay: Payer: PRIVATE HEALTH INSURANCE | Attending: Hematology

## 2020-09-15 ENCOUNTER — Other Ambulatory Visit: Payer: Self-pay

## 2020-09-15 VITALS — BP 110/75 | HR 93 | Temp 98.6°F | Resp 16

## 2020-09-15 DIAGNOSIS — D509 Iron deficiency anemia, unspecified: Secondary | ICD-10-CM

## 2020-09-15 MED ORDER — ACETAMINOPHEN 325 MG PO TABS
650.0000 mg | ORAL_TABLET | Freq: Once | ORAL | Status: AC
  Administered 2020-09-15: 650 mg via ORAL

## 2020-09-15 MED ORDER — LORATADINE 10 MG PO TABS
10.0000 mg | ORAL_TABLET | Freq: Once | ORAL | Status: AC
  Administered 2020-09-15: 10 mg via ORAL

## 2020-09-15 MED ORDER — LORATADINE 10 MG PO TABS
ORAL_TABLET | ORAL | Status: AC
  Filled 2020-09-15: qty 1

## 2020-09-15 MED ORDER — ACETAMINOPHEN 325 MG PO TABS
ORAL_TABLET | ORAL | Status: AC
  Filled 2020-09-15: qty 2

## 2020-09-15 MED ORDER — SODIUM CHLORIDE 0.9 % IV SOLN
Freq: Once | INTRAVENOUS | Status: AC
  Filled 2020-09-15: qty 250

## 2020-09-15 MED ORDER — SODIUM CHLORIDE 0.9 % IV SOLN
750.0000 mg | Freq: Once | INTRAVENOUS | Status: AC
  Administered 2020-09-15: 750 mg via INTRAVENOUS
  Filled 2020-09-15: qty 15

## 2020-09-15 NOTE — Patient Instructions (Signed)
Ferric carboxymaltose injection What is this medicine? FERRIC CARBOXYMALTOSE (ferr-ik car-box-ee-mol-toes) is an iron complex. Iron is used to make healthy red blood cells, which carry oxygen and nutrients throughout the body. This medicine is used to treat anemia in people with chronic kidney disease or people who cannot take iron by mouth. This medicine may be used for other purposes; ask your health care provider or pharmacist if you have questions. COMMON BRAND NAME(S): Injectafer What should I tell my health care provider before I take this medicine? They need to know if you have any of these conditions:  high levels of iron in the blood  liver disease  an unusual or allergic reaction to iron, other medicines, foods, dyes, or preservatives  pregnant or trying to get pregnant  breast-feeding How should I use this medicine? This medicine is for infusion into a vein. It is given by a health care professional in a hospital or clinic setting. Talk to your pediatrician regarding the use of this medicine in children. Special care may be needed. Overdosage: If you think you have taken too much of this medicine contact a poison control center or emergency room at once. NOTE: This medicine is only for you. Do not share this medicine with others. What if I miss a dose? Keep appointments for follow-up doses. It is important not to miss your dose. Call your care team if you are unable to keep an appointment. What may interact with this medicine? Do not take this medicine with any of the following medications:  deferoxamine  dimercaprol  other iron products This list may not describe all possible interactions. Give your health care provider a list of all the medicines, herbs, non-prescription drugs, or dietary supplements you use. Also tell them if you smoke, drink alcohol, or use illegal drugs. Some items may interact with your medicine. What should I watch for while using this  medicine? Visit your doctor or health care professional regularly. Tell your doctor if your symptoms do not start to get better or if they get worse. You may need blood work done while you are taking this medicine. You may need to follow a special diet. Talk to your doctor. Foods that contain iron include: whole grains/cereals, dried fruits, beans, or peas, leafy green vegetables, and organ meats (liver, kidney). What side effects may I notice from receiving this medicine? Side effects that you should report to your doctor or health care professional as soon as possible:  allergic reactions like skin rash, itching or hives, swelling of the face, lips, or tongue  dizziness  facial flushing Side effects that usually do not require medical attention (report to your doctor or health care professional if they continue or are bothersome):  changes in taste  constipation  headache  nausea, vomiting  pain, redness, or irritation at site where injected This list may not describe all possible side effects. Call your doctor for medical advice about side effects. You may report side effects to FDA at 1-800-FDA-1088. Where should I keep my medicine? This drug is given in a hospital or clinic and will not be stored at home. NOTE: This sheet is a summary. It may not cover all possible information. If you have questions about this medicine, talk to your doctor, pharmacist, or health care provider.  2021 Elsevier/Gold Standard (2020-06-02 14:00:47)  

## 2020-09-18 ENCOUNTER — Encounter: Payer: Self-pay | Admitting: Anesthesiology

## 2020-09-22 ENCOUNTER — Inpatient Hospital Stay: Payer: PRIVATE HEALTH INSURANCE

## 2020-09-22 ENCOUNTER — Other Ambulatory Visit: Payer: Self-pay

## 2020-09-22 VITALS — BP 111/66 | HR 71 | Temp 98.7°F | Resp 16

## 2020-09-22 DIAGNOSIS — D509 Iron deficiency anemia, unspecified: Secondary | ICD-10-CM | POA: Diagnosis not present

## 2020-09-22 MED ORDER — ACETAMINOPHEN 325 MG PO TABS
ORAL_TABLET | ORAL | Status: AC
  Filled 2020-09-22: qty 2

## 2020-09-22 MED ORDER — SODIUM CHLORIDE 0.9 % IV SOLN
750.0000 mg | Freq: Once | INTRAVENOUS | Status: AC
  Administered 2020-09-22: 750 mg via INTRAVENOUS
  Filled 2020-09-22: qty 15

## 2020-09-22 MED ORDER — ACETAMINOPHEN 325 MG PO TABS
650.0000 mg | ORAL_TABLET | Freq: Once | ORAL | Status: AC
  Administered 2020-09-22: 650 mg via ORAL

## 2020-09-22 MED ORDER — LORATADINE 10 MG PO TABS
ORAL_TABLET | ORAL | Status: AC
  Filled 2020-09-22: qty 1

## 2020-09-22 MED ORDER — SODIUM CHLORIDE 0.9 % IV SOLN
Freq: Once | INTRAVENOUS | Status: AC
  Filled 2020-09-22: qty 250

## 2020-09-22 MED ORDER — LORATADINE 10 MG PO TABS
10.0000 mg | ORAL_TABLET | Freq: Once | ORAL | Status: AC
  Administered 2020-09-22: 10 mg via ORAL

## 2020-09-30 ENCOUNTER — Encounter (HOSPITAL_BASED_OUTPATIENT_CLINIC_OR_DEPARTMENT_OTHER): Payer: Self-pay | Admitting: Obstetrics & Gynecology

## 2020-09-30 ENCOUNTER — Other Ambulatory Visit: Payer: Self-pay

## 2020-09-30 NOTE — Progress Notes (Signed)
Spoke w/ via phone for pre-op interview--- PT Lab needs dos----  CBC, Urine preg, Istat (gent)             Lab results------ no COVID test ------ 10-02-2020 @ 1400 Arrive at ------- 0600 on 10-06-2020 NPO after MN NO Solid Food.  Clear liquids from MN until--- 0500 Med rec completed Medications to take morning of surgery ----- NONE Diabetic medication ----- n/a Patient instructed to bring photo id and insurance card day of surgery Patient aware to have Driver (ride ) / caregiver    for 24 hours after surgery -- mother, Joanna Gross  Patient Special Instructions ----- n/a Pre-Op special Istructions ----- n/a Patient verbalized understanding of instructions that were given at this phone interview. Patient denies shortness of breath, chest pain, fever, cough at this phone interview.

## 2020-10-01 ENCOUNTER — Ambulatory Visit: Payer: PRIVATE HEALTH INSURANCE | Admitting: Obstetrics & Gynecology

## 2020-10-01 ENCOUNTER — Encounter: Payer: Self-pay | Admitting: Obstetrics & Gynecology

## 2020-10-01 ENCOUNTER — Ambulatory Visit (INDEPENDENT_AMBULATORY_CARE_PROVIDER_SITE_OTHER): Payer: No Typology Code available for payment source | Admitting: Obstetrics & Gynecology

## 2020-10-01 VITALS — BP 126/82

## 2020-10-01 DIAGNOSIS — D500 Iron deficiency anemia secondary to blood loss (chronic): Secondary | ICD-10-CM | POA: Diagnosis not present

## 2020-10-01 DIAGNOSIS — N920 Excessive and frequent menstruation with regular cycle: Secondary | ICD-10-CM | POA: Diagnosis not present

## 2020-10-01 DIAGNOSIS — D250 Submucous leiomyoma of uterus: Secondary | ICD-10-CM | POA: Diagnosis not present

## 2020-10-01 DIAGNOSIS — D219 Benign neoplasm of connective and other soft tissue, unspecified: Secondary | ICD-10-CM

## 2020-10-01 NOTE — Progress Notes (Signed)
Joanna Gross 1966/06/26 [id number]        55 y.o.  G1P0010   RP: Preop HSC/Sonata procedure/possible Myosure Excision/D+C  HPI: Menorrhagia with secondary anemia.  Uterine Fibroids with a SM fibroid.   OB History  Gravida Para Term Preterm AB Living  1 0     1 0  SAB IAB Ectopic Multiple Live Births               # Outcome Date GA Lbr Len/2nd Weight Sex Delivery Anes PTL Lv  1 AB             Past medical history,surgical history, problem list, medications, allergies, family history and social history were all reviewed and documented in the EPIC chart.   Directed ROS with pertinent positives and negatives documented in the history of present illness/assessment and plan.  Exam:  Vitals:   10/01/20 1017  BP: 126/82   General appearance:  Normal  Gyn exam deferred  Sono Infusion Hysterogram ( procedure note) in 05/2020:  The initial transvaginal ultrasound demonstrated the following: T/Aand T/V images.Grossly enlarged uterus with multiple fibroids, largest are measured at 4.19,3.58,3.73,3.16and5.17 cm.The overall uterine size is measured at 14.83 x 11.53 x 7.76 cm. The endometrial cavity is distorted by surrounding fibroids. The endometrial lining is measured at 8.51 mm. Both ovaries are small with atrophic appearance. No free fluid in the posterior cul-de-sac.  The speculum was inserted and the cervix cleansed with Betadine solution after confirming that patient has no allergies.A small sonohysterography catheterwas utilized. Insertion was facilitated with ring forceps, using a spear-like motion the catheter was inserted to the fundus of the uterus. The speculum is then removed carefully to avoid dislodging the catheter. The catheter was flushed with sterile saline delete prior to insertion to rid it of small amounts of air.the sterile saline solution was infused into the uterine cavity as a vaginal ultrasound probe was then placed in the vagina for full  visualization of the uterine cavity from a transvaginal approach. The following was noted: Saline infusion reveals a 4 cm submucosal fibroid as well as an adjacent 2.8 cm fibroid partially protruding into the uterine cavity.  The catheter was then removed after retrieving some of the saline from the intrauterine cavity. An endometrial biopsywas notdone. Patient tolerated procedure well. She had received a tablet of Aleve for discomfort.    Assessment/Plan:  55 y.o. G1P0010   1. Fibroids Enlarged uterus with many fibroids including 2 fibroids with a significant submucosal component.  Hysteroscopy with Sonata procedure and possible MyoSure excision of fibroids and D&C scheduled.  Surgical procedure including preop preparation, surgical risks and postop precautions thoroughly reviewed.  Patient voiced understanding and agreement with plan.  2. Submucous uterine fibroid As above.  3. Menorrhagia with regular cycle We will continue on the progestin only birth control pill until surgery.  4. Iron deficiency anemia due to chronic blood loss Iron supplementation to continue.                        Patient was counseled as to the risk of surgery to include the following:  1. Infection (prohylactic antibiotics will be administered)  2. DVT/Pulmonary Embolism (prophylactic pneumo compression stockings will be used)  3.Trauma to internal organs requiring additional surgical procedure to repair any injury to internal organs requiring perhaps additional hospitalization days.  4.Hemmorhage requiring transfusion and blood products which carry risks such as anaphylactic reaction, hepatitis and AIDS  Patient had  received literature information on the procedure scheduled and all her questions were answered and fully accepts all risk.   Princess Bruins MD, 10:47 AM 10/01/2020

## 2020-10-01 NOTE — Progress Notes (Signed)
HEMATOLOGY/ONCOLOGY CLINIC NOTE  Date of Service: 10/02/2020  Patient Care Team: Hayden Rasmussen, MD as PCP - General (Family Medicine)  CHIEF COMPLAINTS/PURPOSE OF CONSULTATION:  Severe anemia  HISTORY OF PRESENTING ILLNESS:  Joanna Gross is a wonderful 55 y.o. female who has been referred to [place] by Dr. Darron Doom for evaluation and management of severe anemia. Pt is accompanied today by her mother. The pt reports that she is doing well overall.   The pt reports that her menstrual cycles last 10-12 days, with 2-3 heavy days. Pt has a history of Fibroids and two myomectomies. She currently has a fibroid. She has never used oral birth control. Pt has used Progesterone once to halt her period. Pt is currently following with an OBGYN.   Pt has a long history of anemia and required two units of blood in 2019. She has never been given IV Iron. She was given PO Iron, but was unable to continue taking it as it was causing constipation. Pt has been taking prenatal iron since 2019 and was switched to Fusion Plus iron on Monday. Pt has not had a Colonoscopy since turning 52 and reports regular bowel movements. She has significant ice cravings.  She currently has a sinus infection and began taking Doxycycline on Monday. Pt has lost 15 lbs in the last month with effort. She has recently developed new allergies. She has a history of Vitamin D deficiency. 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 Pt has no family history of blood disorders. Pt has had ankle swelling and restless legs at night recently.   Most recent lab results (03/03/2020) of CBC is as follows: all values are WNL except for Hgb at 6.6, MCV at 69. 03/03/2020 Ferritin at 4, Iron Sat 3.  05/18/2019 Vitamin D 25 (OH) at 16.7 05/18/2019 Ferritin at 13.2  On review of systems, pt reports palpitations, headaches, SOB, dizziness, lightheadedness, left forearm numbness, muscle cramping, numbness/tingling in legs and denies  bloody/black stools, bowel habit changes, fevers, abdominal pain, unexpected weight loss and any other symptoms.   On PMHx the pt reports Anemia, Heart murmur, Vitamin D deficiency, Fibroids, Myomectomy x2.  INTERVAL HISTORY:  Joanna Gross is a wonderful 55 y.o. female who is here for evaluation and management of severe anemia. The patient's last visit with [place] was on 06/04/2020. The pt reports that she is doing well overall. We are joined today by her mother.  The pt reports that she has been very fatigued. She received two more doses of the IV Iron. The pt saw her OBGYN yesterday and she will be getting an ablation on 04/05 to help with controlling the bleeding. The pt notes that she has continued to have heavy periods that last two weeks and have blood clots. The pt notes some intermittent pains on her right side of lower back. The pt notes that she intermittently experiences coughing in the middle of the night, as well as acid reflux.  Lab results today 10/02/2020 of CBC w/diff and CMP is as follows: all values are WNL except for  Hgb of 11.3, RDW of 25.3. CMP pending. Lab Results  Component Value Date   IRON 91 10/02/2020   TIBC 325 10/02/2020   IRONPCTSAT 28 10/02/2020   (Iron and TIBC)  Lab Results  Component Value Date   FERRITIN 378 (H) 10/02/2020   On review of systems, pt reports chronic cough, heavy menstrual periods and denies abdominal pain, changes in  bowel habits, changes in urination, and any other symptoms.  MEDICAL HISTORY:  Past Medical History:  Diagnosis Date  . Fatigue associated with anemia   . History of seizure 1975   per pt had one seizure in 1975, put on medication until 1988, pt stated no seizure since  . IDA (iron deficiency anemia)    followed by dr Irene Limbo (hematology)---  chronic blood loss from uterine fibroids,  treatment iron infusions  . Menometrorrhagia   . Pelvic pain   . SOB (shortness of breath) on exertion    per pt due to anemia  .  Uterine fibroid     SURGICAL HISTORY: Past Surgical History:  Procedure Laterality Date  . BREAST SURGERY Bilateral 1987;  1992;  2009   excision mass biopsy, pt stated benign  . BUNIONECTOMY Bilateral 1999  . CYST EXCISION  1974   left side of neck,  per pt benign  . CYSTECTOMY  1974   left side of neck   . MYOMECTOMY ABDOMINAL APPROACH  2003;  2015    SOCIAL HISTORY: Social History   Socioeconomic History  . Marital status: Divorced    Spouse name: n/a  . Number of children: 0  . Years of education: Not on file  . Highest education level: Not on file  Occupational History  . Occupation: [employer]    Comment: self-employed  Tobacco Use  . Smoking status: Never Smoker  . Smokeless tobacco: Never Used  Vaping Use  . Vaping Use: Never used  Substance and Sexual Activity  . Alcohol use: Not Currently    Comment: RARE  . Drug use: Never  . Sexual activity: Not on file    Comment: 1st intercourse- 21, partners- 83, divorced   Other Topics Concern  . Not on file  Social History Narrative  . Not on file   Social Determinants of Health   Financial Resource Strain: Not on file  Food Insecurity: Not on file  Transportation Needs: Not on file  Physical Activity: Not on file  Stress: Not on file  Social Connections: Not on file  Intimate Partner Violence: Not on file    FAMILY HISTORY: Family History  Problem Relation Age of Onset  . Arthritis Mother   . Hypertension Father   . Diabetes Father     ALLERGIES:  is allergic to amoxicillin, erythromycin, furosemide, penicillins, bee venom, and eucalyptus flavor [flavoring agent].  MEDICATIONS:  Current Outpatient Medications  Medication Sig Dispense Refill  . cholecalciferol (VITAMIN D3) 25 MCG (1000 UNIT) tablet Take 1,000 Units by mouth daily.    Marland Kitchen co-enzyme Q-10 30 MG capsule Take 30 mg by mouth 3 (three) times daily.    . Cyanocobalamin (B-12 PO) Take by mouth daily.    Marland Kitchen ELDERBERRY PO Take by mouth.    .  lactobacillus acidophilus (BACID) TABS tablet Take 2 tablets by mouth 2 (two) times daily.    . norethindrone (MICRONOR) 0.35 MG tablet Take 1 tablet (0.35 mg total) by mouth daily. (Patient taking differently: Take 1 tablet by mouth at bedtime.) 84 tablet 4  . Omega-3 Fatty Acids (FISH OIL) 1000 MG CAPS Take by mouth daily.     No current facility-administered medications for this visit.    REVIEW OF SYSTEMS:   10 Point review of Systems was done is negative except as noted above.  PHYSICAL EXAMINATION: ECOG PERFORMANCE STATUS: 1 - Symptomatic but completely ambulatory  . Vitals:   10/02/20 1125  BP: 116/62  Pulse: 80  Resp: 18  Temp: (!) 97.4 F (36.3 C)  SpO2: 100%   Filed Weights   10/02/20 1125  Weight: 249 lb 8 oz (113.2 kg)   .Body mass index is 40.89 kg/m.    GENERAL:alert, in no acute distress and comfortable SKIN: no acute rashes, no significant lesions EYES: conjunctiva are pink and non-injected, sclera anicteric OROPHARYNX: MMM, no exudates, no oropharyngeal erythema or ulceration NECK: supple, no JVD LYMPH:  no palpable lymphadenopathy in the cervical, axillary or inguinal regions LUNGS: clear to auscultation b/l with normal respiratory effort HEART: regular rate & rhythm ABDOMEN:  normoactive bowel sounds , non tender, not distended. Extremity: no pedal edema PSYCH: alert & oriented x 3 with fluent speech NEURO: no focal motor/sensory deficits  LABORATORY DATA:  I have reviewed the data as listed  . CBC Latest Ref Rng & Units 10/06/2020 10/06/2020 10/02/2020  WBC 4.0 - 10.5 K/uL - 5.7 5.5  Hemoglobin 12.0 - 15.0 g/dL 11.9(L) 10.9(L) 11.3(L)  Hematocrit 36.0 - 46.0 % 35.0(L) 35.1(L) 36.2  Platelets 150 - 400 K/uL - 187 203   . CBC    Component Value Date/Time   WBC 5.7 10/06/2020 0721   RBC 3.97 10/06/2020 0721   HGB 11.9 (L) 10/06/2020 0727   HGB 10.7 (L) 06/04/2020 1343   HCT 35.0 (L) 10/06/2020 0727   HCT 36.0 06/04/2020 1344   PLT 187  10/06/2020 0721   PLT 249 06/04/2020 1343   MCV 88.4 10/06/2020 0721   MCV 79.1 (A) 08/06/2013 1534   MCH 27.5 10/06/2020 0721   MCHC 31.1 10/06/2020 0721   RDW 25.9 (H) 10/06/2020 0721   LYMPHSABS 1.5 10/02/2020 1058   MONOABS 0.4 10/02/2020 1058   EOSABS 0.1 10/02/2020 1058   BASOSABS 0.0 10/02/2020 1058    . CMP Latest Ref Rng & Units 10/02/2020 06/04/2020 03/25/2020  Glucose 70 - 99 mg/dL 111(H) 91 110(H)  BUN 6 - 20 mg/dL 7 11 6   Creatinine 0.44 - 1.00 mg/dL 0.73 0.72 0.72  Sodium 135 - 145 mmol/L 141 139 137  Potassium 3.5 - 5.1 mmol/L 3.8 4.0 3.9  Chloride 98 - 111 mmol/L 106 106 106  CO2 22 - 32 mmol/L 23 24 26   Calcium 8.9 - 10.3 mg/dL 9.1 10.2 9.8  Total Protein 6.5 - 8.1 g/dL 8.0 8.1 7.9  Total Bilirubin 0.3 - 1.2 mg/dL 0.3 0.3 0.4  Alkaline Phos 38 - 126 U/L 83 89 78  AST 15 - 41 U/L 22 24 18   ALT 0 - 44 U/L 34 35 17   . Lab Results  Component Value Date   IRON 40 (L) 06/04/2020   TIBC 386 06/04/2020   IRONPCTSAT 10 (L) 06/04/2020   (Iron and TIBC)  Lab Results  Component Value Date   FERRITIN 15 06/04/2020    . Lab Results  Component Value Date   IRON 91 10/02/2020   TIBC 325 10/02/2020   IRONPCTSAT 28 10/02/2020   (Iron and TIBC)  Lab Results  Component Value Date   FERRITIN 378 (H) 10/02/2020      RADIOGRAPHIC STUDIES: I have personally reviewed the radiological images as listed and agreed with the findings in the report. No results found.  ASSESSMENT & PLAN:   55 yo with   1) Severe iron deficiency with microcytic severe anemia  PLAN: -Discussed pt labwork today, 10/02/2020; Hgb improved, ferritin WNL after recurrent rpt IV Iron -Advised pt that we will continue to monitor her iron labs  -no indication for  additional IV Iron at this time. -Advised pt that with her Hgb of 11.3 she is okay to get any surgery. -Recommend pt hydrate well, wear compression socks, and ambulate every hour when completing long-distance travel. -Continue  OTC B-complex daily.  -Will see back in 6 months with labs.   FOLLOW UP: RTC with Dr Irene Limbo with labs in 6 months   The total time spent in the appointment was 20 minutes and more than 50% was on counseling and direct patient cares.   All of the patient's questions were answered with apparent satisfaction. The patient knows to call the clinic with any problems, questions or concerns.    Sullivan Lone MD [place] [facility] Hematology/Oncology Physician [facility]  (Office):       [phone] (Work cell):  [phone] (Fax):           [phone]  10/02/2020 11:49 AM  I, Reinaldo Raddle, am acting as scribe for Dr. Sullivan Lone, MD.    .I have reviewed the above documentation for accuracy and completeness, and I agree with the above. Brunetta Genera MD

## 2020-10-02 ENCOUNTER — Other Ambulatory Visit: Payer: Self-pay

## 2020-10-02 ENCOUNTER — Telehealth: Payer: Self-pay | Admitting: *Deleted

## 2020-10-02 ENCOUNTER — Telehealth: Payer: Self-pay | Admitting: Hematology

## 2020-10-02 ENCOUNTER — Inpatient Hospital Stay: Payer: No Typology Code available for payment source | Attending: Hematology

## 2020-10-02 ENCOUNTER — Other Ambulatory Visit (HOSPITAL_COMMUNITY)
Admission: RE | Admit: 2020-10-02 | Discharge: 2020-10-02 | Disposition: A | Discharge status: Home / Self Care | Payer: No Typology Code available for payment source | Source: Ambulatory Visit | Attending: Obstetrics & Gynecology | Admitting: Obstetrics & Gynecology

## 2020-10-02 ENCOUNTER — Encounter: Payer: Self-pay | Admitting: Obstetrics & Gynecology

## 2020-10-02 ENCOUNTER — Inpatient Hospital Stay (HOSPITAL_BASED_OUTPATIENT_CLINIC_OR_DEPARTMENT_OTHER): Payer: No Typology Code available for payment source | Admitting: Hematology

## 2020-10-02 VITALS — BP 116/62 | HR 80 | Temp 97.4°F | Resp 18 | Ht 65.5 in | Wt 249.5 lb

## 2020-10-02 DIAGNOSIS — Z1152 Encounter for screening for COVID-19: Secondary | ICD-10-CM | POA: Diagnosis not present

## 2020-10-02 DIAGNOSIS — R053 Chronic cough: Secondary | ICD-10-CM | POA: Diagnosis not present

## 2020-10-02 DIAGNOSIS — M7989 Other specified soft tissue disorders: Secondary | ICD-10-CM | POA: Diagnosis not present

## 2020-10-02 DIAGNOSIS — N920 Excessive and frequent menstruation with regular cycle: Secondary | ICD-10-CM | POA: Diagnosis not present

## 2020-10-02 DIAGNOSIS — D509 Iron deficiency anemia, unspecified: Secondary | ICD-10-CM

## 2020-10-02 DIAGNOSIS — K219 Gastro-esophageal reflux disease without esophagitis: Secondary | ICD-10-CM | POA: Insufficient documentation

## 2020-10-02 DIAGNOSIS — Z01812 Encounter for preprocedural laboratory examination: Secondary | ICD-10-CM | POA: Insufficient documentation

## 2020-10-02 DIAGNOSIS — G2581 Restless legs syndrome: Secondary | ICD-10-CM | POA: Diagnosis not present

## 2020-10-02 DIAGNOSIS — Z20822 Contact with and (suspected) exposure to covid-19: Secondary | ICD-10-CM | POA: Insufficient documentation

## 2020-10-02 LAB — CMP (CANCER CENTER ONLY)
ALT: 34 U/L (ref 0–44)
AST: 22 U/L (ref 15–41)
Albumin: 4.3 g/dL (ref 3.5–5.0)
Alkaline Phosphatase: 83 U/L (ref 38–126)
Anion gap: 12 (ref 5–15)
BUN: 7 mg/dL (ref 6–20)
CO2: 23 mmol/L (ref 22–32)
Calcium: 9.1 mg/dL (ref 8.9–10.3)
Chloride: 106 mmol/L (ref 98–111)
Creatinine: 0.73 mg/dL (ref 0.44–1.00)
GFR, Estimated: >60 mL/min (ref >60)
Glucose, Bld: 111 mg/dL — ABNORMAL HIGH (ref 70–99)
Potassium: 3.8 mmol/L (ref 3.5–5.1)
Sodium: 141 mmol/L (ref 135–145)
Total Bilirubin: 0.3 mg/dL (ref 0.3–1.2)
Total Protein: 8 g/dL (ref 6.5–8.1)

## 2020-10-02 LAB — CBC WITH DIFFERENTIAL/PLATELET
Abs Immature Granulocytes: 0.02 10*3/uL (ref 0.00–0.07)
Basophils Absolute: 0 10*3/uL (ref 0.0–0.1)
Basophils Relative: 1 %
Eosinophils Absolute: 0.1 10*3/uL (ref 0.0–0.5)
Eosinophils Relative: 1 %
HCT: 36.2 % (ref 36.0–46.0)
Hemoglobin: 11.3 g/dL — ABNORMAL LOW (ref 12.0–15.0)
Immature Granulocytes: 0 %
Lymphocytes Relative: 28 %
Lymphs Abs: 1.5 10*3/uL (ref 0.7–4.0)
MCH: 27 pg (ref 26.0–34.0)
MCHC: 31.2 g/dL (ref 30.0–36.0)
MCV: 86.4 fL (ref 80.0–100.0)
Monocytes Absolute: 0.4 10*3/uL (ref 0.1–1.0)
Monocytes Relative: 7 %
Neutro Abs: 3.5 10*3/uL (ref 1.7–7.7)
Neutrophils Relative %: 63 %
Platelets: 203 10*3/uL (ref 150–400)
RBC: 4.19 MIL/uL (ref 3.87–5.11)
RDW: 25.3 % — ABNORMAL HIGH (ref 11.5–15.5)
WBC: 5.5 10*3/uL (ref 4.0–10.5)
nRBC: 0 % (ref 0.0–0.2)

## 2020-10-02 LAB — IRON AND TIBC
Iron: 91 ug/dL (ref 41–142)
Saturation Ratios: 28 % (ref 21–57)
TIBC: 325 ug/dL (ref 236–444)
UIBC: 234 ug/dL (ref 120–384)

## 2020-10-02 LAB — FERRITIN: Ferritin: 378 ng/mL — ABNORMAL HIGH (ref 11–307)

## 2020-10-02 NOTE — Telephone Encounter (Signed)
Call to patient. Patient left voicemail on Triage line stating labs in Epic from today for surgery scheduled on 10-06-20. RN returned call to patient and advised could see labs drawn today and would advise Dr. Dellis Filbert. Patient agreeable and appreciative of phone call. Patient states she also had her covid testing done at 1400 today. Advised would return call if any additional recommendations from Dr. Dellis Filbert. Patient agreeable.   Routing to provider for review.

## 2020-10-02 NOTE — Telephone Encounter (Signed)
Scheduled follow-up appointment per 4/1 los. Patient is aware. 

## 2020-10-03 LAB — SARS CORONAVIRUS 2 (TAT 6-24 HRS): SARS Coronavirus 2: NEGATIVE

## 2020-10-05 NOTE — Anesthesia Preprocedure Evaluation (Addendum)
Anesthesia Evaluation  Patient identified by MRN, date of birth, ID band Patient awake    Reviewed: Allergy & Precautions, NPO status , Patient's Chart, lab work & pertinent test results  Airway Mallampati: II  TM Distance: >3 FB Neck ROM: Full    Dental no notable dental hx.    Pulmonary neg pulmonary ROS,    Pulmonary exam normal breath sounds clear to auscultation       Cardiovascular negative cardio ROS Normal cardiovascular exam Rhythm:Regular Rate:Normal     Neuro/Psych negative neurological ROS  negative psych ROS   GI/Hepatic negative GI ROS, Neg liver ROS,   Endo/Other  Morbid obesity  Renal/GU negative Renal ROS     Musculoskeletal negative musculoskeletal ROS (+)   Abdominal (+) + obese,   Peds  Hematology  (+) anemia ,   Anesthesia Other Findings fibroids, submucous myoma, menorrhagia, anemia  Reproductive/Obstetrics                            Anesthesia Physical Anesthesia Plan  ASA: III  Anesthesia Plan: General   Post-op Pain Management:    Induction: Intravenous  PONV Risk Score and Plan: 4 or greater and Ondansetron, Dexamethasone, Midazolam, Treatment may vary due to age or medical condition and Scopolamine patch - Pre-op  Airway Management Planned: Oral ETT  Additional Equipment:   Intra-op Plan:   Post-operative Plan: Extubation in OR  Informed Consent: I have reviewed the patients History and Physical, chart, labs and discussed the procedure including the risks, benefits and alternatives for the proposed anesthesia with the patient or authorized representative who has indicated his/her understanding and acceptance.     Dental advisory given  Plan Discussed with: CRNA  Anesthesia Plan Comments:       Anesthesia Quick Evaluation

## 2020-10-06 ENCOUNTER — Encounter (HOSPITAL_BASED_OUTPATIENT_CLINIC_OR_DEPARTMENT_OTHER): Payer: Self-pay | Admitting: Obstetrics & Gynecology

## 2020-10-06 ENCOUNTER — Encounter (HOSPITAL_BASED_OUTPATIENT_CLINIC_OR_DEPARTMENT_OTHER)
Admission: RE | Disposition: A | Discharge status: Home / Self Care | Payer: Self-pay | Source: Home / Self Care | Attending: Obstetrics & Gynecology

## 2020-10-06 ENCOUNTER — Ambulatory Visit (HOSPITAL_BASED_OUTPATIENT_CLINIC_OR_DEPARTMENT_OTHER)
Admission: RE | Admit: 2020-10-06 | Discharge: 2020-10-06 | Disposition: A | Discharge status: Home / Self Care | Payer: PRIVATE HEALTH INSURANCE | Attending: Obstetrics & Gynecology | Admitting: Obstetrics & Gynecology

## 2020-10-06 ENCOUNTER — Ambulatory Visit (HOSPITAL_BASED_OUTPATIENT_CLINIC_OR_DEPARTMENT_OTHER): Payer: PRIVATE HEALTH INSURANCE | Admitting: Anesthesiology

## 2020-10-06 ENCOUNTER — Other Ambulatory Visit: Payer: Self-pay

## 2020-10-06 DIAGNOSIS — Z888 Allergy status to other drugs, medicaments and biological substances status: Secondary | ICD-10-CM | POA: Diagnosis not present

## 2020-10-06 DIAGNOSIS — Z880 Allergy status to penicillin: Secondary | ICD-10-CM | POA: Insufficient documentation

## 2020-10-06 DIAGNOSIS — Z881 Allergy status to other antibiotic agents status: Secondary | ICD-10-CM | POA: Insufficient documentation

## 2020-10-06 DIAGNOSIS — Z91030 Bee allergy status: Secondary | ICD-10-CM | POA: Diagnosis not present

## 2020-10-06 DIAGNOSIS — Z79890 Hormone replacement therapy (postmenopausal): Secondary | ICD-10-CM | POA: Diagnosis not present

## 2020-10-06 DIAGNOSIS — D500 Iron deficiency anemia secondary to blood loss (chronic): Secondary | ICD-10-CM | POA: Insufficient documentation

## 2020-10-06 DIAGNOSIS — Z79899 Other long term (current) drug therapy: Secondary | ICD-10-CM | POA: Insufficient documentation

## 2020-10-06 DIAGNOSIS — D250 Submucous leiomyoma of uterus: Secondary | ICD-10-CM | POA: Insufficient documentation

## 2020-10-06 DIAGNOSIS — N920 Excessive and frequent menstruation with regular cycle: Secondary | ICD-10-CM | POA: Insufficient documentation

## 2020-10-06 DIAGNOSIS — Z9102 Food additives allergy status: Secondary | ICD-10-CM | POA: Diagnosis not present

## 2020-10-06 DIAGNOSIS — D251 Intramural leiomyoma of uterus: Secondary | ICD-10-CM | POA: Diagnosis not present

## 2020-10-06 HISTORY — DX: Iron deficiency anemia, unspecified: D50.9

## 2020-10-06 HISTORY — PX: HYSTEROSCOPY WITH D & C: SHX1775

## 2020-10-06 HISTORY — DX: Leiomyoma of uterus, unspecified: D25.9

## 2020-10-06 HISTORY — DX: Anemia, unspecified: D64.9

## 2020-10-06 HISTORY — DX: Excessive and frequent menstruation with irregular cycle: N92.1

## 2020-10-06 HISTORY — DX: Shortness of breath: R06.02

## 2020-10-06 HISTORY — DX: Pelvic and perineal pain: R10.2

## 2020-10-06 LAB — CBC
HCT: 35.1 % — ABNORMAL LOW (ref 36.0–46.0)
Hemoglobin: 10.9 g/dL — ABNORMAL LOW (ref 12.0–15.0)
MCH: 27.5 pg (ref 26.0–34.0)
MCHC: 31.1 g/dL (ref 30.0–36.0)
MCV: 88.4 fL (ref 80.0–100.0)
Platelets: 187 10*3/uL (ref 150–400)
RBC: 3.97 MIL/uL (ref 3.87–5.11)
RDW: 25.9 % — ABNORMAL HIGH (ref 11.5–15.5)
WBC: 5.7 10*3/uL (ref 4.0–10.5)
nRBC: 0 % (ref 0.0–0.2)

## 2020-10-06 LAB — POCT I-STAT, CHEM 8
BUN: 7 mg/dL (ref 6–20)
Calcium, Ion: 1.03 mmol/L — ABNORMAL LOW (ref 1.15–1.40)
Chloride: 107 mmol/L (ref 98–111)
Creatinine, Ser: 0.6 mg/dL (ref 0.44–1.00)
Glucose, Bld: 94 mg/dL (ref 70–99)
HCT: 35 % — ABNORMAL LOW (ref 36.0–46.0)
Hemoglobin: 11.9 g/dL — ABNORMAL LOW (ref 12.0–15.0)
Potassium: 3.6 mmol/L (ref 3.5–5.1)
Sodium: 138 mmol/L (ref 135–145)
TCO2: 24 mmol/L (ref 22–32)

## 2020-10-06 LAB — POCT PREGNANCY, URINE: Preg Test, Ur: NEGATIVE

## 2020-10-06 SURGERY — RADIOFREQUENCY ABLATION, LEIOMYOMA, UTERUS, TRANSCERVICAL APPROACH, WITH US GUIDANCE
Anesthesia: General | Site: Vagina

## 2020-10-06 MED ORDER — AMISULPRIDE (ANTIEMETIC) 5 MG/2ML IV SOLN
10.0000 mg | Freq: Once | INTRAVENOUS | Status: AC | PRN
  Administered 2020-10-06: 10 mg via INTRAVENOUS

## 2020-10-06 MED ORDER — IBUPROFEN 600 MG PO TABS: 600.0000 mg | ORAL_TABLET | Freq: Four times a day (QID) | ORAL | 0 refills | Status: DC | PRN

## 2020-10-06 MED ORDER — ONDANSETRON HCL 4 MG/2ML IJ SOLN
INTRAMUSCULAR | Status: AC
  Filled 2020-10-06: qty 2

## 2020-10-06 MED ORDER — MIDAZOLAM HCL 2 MG/2ML IJ SOLN
INTRAMUSCULAR | Status: AC
  Filled 2020-10-06: qty 2

## 2020-10-06 MED ORDER — CLINDAMYCIN PHOSPHATE 900 MG/50ML IV SOLN
900.0000 mg | INTRAVENOUS | Status: AC
  Administered 2020-10-06: 900 mg via INTRAVENOUS

## 2020-10-06 MED ORDER — LIDOCAINE 2% (20 MG/ML) 5 ML SYRINGE
INTRAMUSCULAR | Status: DC | PRN
  Administered 2020-10-06: 50 mg via INTRAVENOUS

## 2020-10-06 MED ORDER — PROPOFOL 10 MG/ML IV BOLUS
INTRAVENOUS | Status: AC
  Filled 2020-10-06: qty 40

## 2020-10-06 MED ORDER — KETOROLAC TROMETHAMINE 30 MG/ML IJ SOLN
INTRAMUSCULAR | Status: AC
  Filled 2020-10-06: qty 1

## 2020-10-06 MED ORDER — ACETAMINOPHEN 500 MG PO TABS
1000.0000 mg | ORAL_TABLET | Freq: Once | ORAL | Status: AC
  Administered 2020-10-06: 1000 mg via ORAL

## 2020-10-06 MED ORDER — LACTATED RINGERS IV SOLN: INTRAVENOUS | Status: DC

## 2020-10-06 MED ORDER — CLINDAMYCIN PHOSPHATE 900 MG/50ML IV SOLN
INTRAVENOUS | Status: AC
  Filled 2020-10-06: qty 50

## 2020-10-06 MED ORDER — OXYCODONE HCL 5 MG/5ML PO SOLN: 5.0000 mg | Freq: Once | ORAL | Status: DC | PRN

## 2020-10-06 MED ORDER — SCOPOLAMINE 1 MG/3DAYS TD PT72
MEDICATED_PATCH | TRANSDERMAL | Status: AC
  Filled 2020-10-06: qty 1

## 2020-10-06 MED ORDER — FENTANYL CITRATE (PF) 100 MCG/2ML IJ SOLN
INTRAMUSCULAR | Status: DC | PRN
  Administered 2020-10-06: 100 ug via INTRAVENOUS
  Administered 2020-10-06: 50 ug via INTRAVENOUS
  Administered 2020-10-06: 25 ug via INTRAVENOUS
  Administered 2020-10-06: 50 ug via INTRAVENOUS
  Administered 2020-10-06: 25 ug via INTRAVENOUS

## 2020-10-06 MED ORDER — MIDAZOLAM HCL 5 MG/5ML IJ SOLN
INTRAMUSCULAR | Status: DC | PRN
  Administered 2020-10-06: 2 mg via INTRAVENOUS

## 2020-10-06 MED ORDER — GENTAMICIN SULFATE 40 MG/ML IJ SOLN
400.0000 mg | INTRAVENOUS | Status: AC
  Administered 2020-10-06: 400 mg via INTRAVENOUS
  Filled 2020-10-06: qty 10

## 2020-10-06 MED ORDER — CHLOROPROCAINE HCL 1 % IJ SOLN
INTRAMUSCULAR | Status: DC | PRN
  Administered 2020-10-06: 20 mL

## 2020-10-06 MED ORDER — SCOPOLAMINE 1 MG/3DAYS TD PT72
MEDICATED_PATCH | TRANSDERMAL | Status: DC | PRN
  Administered 2020-10-06: 1 via TRANSDERMAL

## 2020-10-06 MED ORDER — DEXAMETHASONE SODIUM PHOSPHATE 10 MG/ML IJ SOLN
INTRAMUSCULAR | Status: DC | PRN
  Administered 2020-10-06: 8 mg via INTRAVENOUS

## 2020-10-06 MED ORDER — FENTANYL CITRATE (PF) 250 MCG/5ML IJ SOLN
INTRAMUSCULAR | Status: AC
  Filled 2020-10-06: qty 5

## 2020-10-06 MED ORDER — LIDOCAINE 2% (20 MG/ML) 5 ML SYRINGE
INTRAMUSCULAR | Status: AC
  Filled 2020-10-06: qty 10

## 2020-10-06 MED ORDER — PROMETHAZINE HCL 25 MG/ML IJ SOLN: 6.2500 mg | INTRAMUSCULAR | Status: DC | PRN

## 2020-10-06 MED ORDER — KETOROLAC TROMETHAMINE 30 MG/ML IJ SOLN: 30.0000 mg | Freq: Once | INTRAMUSCULAR | Status: DC | PRN

## 2020-10-06 MED ORDER — ONDANSETRON HCL 4 MG/2ML IJ SOLN
INTRAMUSCULAR | Status: DC | PRN
  Administered 2020-10-06: 4 mg via INTRAVENOUS

## 2020-10-06 MED ORDER — DEXAMETHASONE SODIUM PHOSPHATE 10 MG/ML IJ SOLN
INTRAMUSCULAR | Status: AC
  Filled 2020-10-06: qty 1

## 2020-10-06 MED ORDER — ROCURONIUM BROMIDE 10 MG/ML (PF) SYRINGE
PREFILLED_SYRINGE | INTRAVENOUS | Status: AC
  Filled 2020-10-06: qty 10

## 2020-10-06 MED ORDER — KETOROLAC TROMETHAMINE 30 MG/ML IJ SOLN
INTRAMUSCULAR | Status: DC | PRN
  Administered 2020-10-06: 30 mg via INTRAVENOUS

## 2020-10-06 MED ORDER — SODIUM CHLORIDE 0.9 % IR SOLN
Status: DC | PRN
  Administered 2020-10-06: 6000 mL
  Administered 2020-10-06 (×2): 3000 mL

## 2020-10-06 MED ORDER — PROPOFOL 10 MG/ML IV BOLUS
INTRAVENOUS | Status: DC | PRN
  Administered 2020-10-06: 150 mg via INTRAVENOUS
  Administered 2020-10-06: 50 mg via INTRAVENOUS

## 2020-10-06 MED ORDER — AMISULPRIDE (ANTIEMETIC) 5 MG/2ML IV SOLN
INTRAVENOUS | Status: AC
  Filled 2020-10-06: qty 4

## 2020-10-06 MED ORDER — ROCURONIUM BROMIDE 10 MG/ML (PF) SYRINGE
PREFILLED_SYRINGE | INTRAVENOUS | Status: DC | PRN
  Administered 2020-10-06: 60 mg via INTRAVENOUS
  Administered 2020-10-06 (×2): 20 mg via INTRAVENOUS

## 2020-10-06 MED ORDER — ACETAMINOPHEN 500 MG PO TABS
ORAL_TABLET | ORAL | Status: AC
  Filled 2020-10-06: qty 2

## 2020-10-06 MED ORDER — FENTANYL CITRATE (PF) 100 MCG/2ML IJ SOLN: 25.0000 ug | INTRAMUSCULAR | Status: DC | PRN

## 2020-10-06 MED ORDER — POVIDONE-IODINE 10 % EX SWAB: 2.0000 "application " | Freq: Once | CUTANEOUS | Status: DC

## 2020-10-06 MED ORDER — OXYCODONE HCL 5 MG PO TABS: 5.0000 mg | ORAL_TABLET | Freq: Once | ORAL | Status: DC | PRN

## 2020-10-06 MED ORDER — SUGAMMADEX SODIUM 200 MG/2ML IV SOLN
INTRAVENOUS | Status: DC | PRN
  Administered 2020-10-06: 240 mg via INTRAVENOUS

## 2020-10-06 SURGICAL SUPPLY — 25 items
CATH ROBINSON RED A/P 16FR (CATHETERS) ×2 IMPLANT
COVER MAYO STAND STRL (DRAPES) ×1 IMPLANT
COVER WAND RF STERILE (DRAPES) ×2 IMPLANT
DILATOR CANAL MILEX (MISCELLANEOUS) ×1 IMPLANT
ELECT DISPERSIVE SONATA (MISCELLANEOUS) ×4 IMPLANT
GAUZE 4X4 16PLY RFD (DISPOSABLE) ×2 IMPLANT
GLOVE SURG ENC MOIS LTX SZ6.5 (GLOVE) ×3 IMPLANT
GLOVE SURG ENC MOIS LTX SZ7.5 (GLOVE) ×1 IMPLANT
GLOVE SURG UNDER POLY LF SZ7 (GLOVE) ×6 IMPLANT
GLOVE SURG UNDER POLY LF SZ7.5 (GLOVE) ×1 IMPLANT
GOWN STRL REUS W/ TWL LRG LVL3 (GOWN DISPOSABLE) IMPLANT
GOWN STRL REUS W/TWL LRG LVL3 (GOWN DISPOSABLE) ×4 IMPLANT
GOWN STRL REUS W/TWL XL LVL3 (GOWN DISPOSABLE) ×1 IMPLANT
HANDPIECE RFA SONATA (MISCELLANEOUS) ×2 IMPLANT
IV NS IRRIG 3000ML ARTHROMATIC (IV SOLUTION) ×5 IMPLANT
KIT PROCEDURE FLUENT (KITS) ×2 IMPLANT
KIT TURNOVER CYSTO (KITS) ×2 IMPLANT
MYOSURE XL FIBROID (MISCELLANEOUS) ×2
PACK VAGINAL MINOR WOMEN LF (CUSTOM PROCEDURE TRAY) ×2 IMPLANT
PAD OB MATERNITY 4.3X12.25 (PERSONAL CARE ITEMS) ×2 IMPLANT
SEAL ROD LENS SCOPE MYOSURE (ABLATOR) ×2 IMPLANT
SYR 50ML LL SCALE MARK (SYRINGE) ×2 IMPLANT
SYSTEM TISS REMOVAL MYOSURE XL (MISCELLANEOUS) IMPLANT
TOWEL OR 17X26 10 PK STRL BLUE (TOWEL DISPOSABLE) ×3 IMPLANT
WATER STERILE IRR 500ML POUR (IV SOLUTION) ×2 IMPLANT

## 2020-10-06 NOTE — Anesthesia Postprocedure Evaluation (Signed)
Anesthesia Post Note  Patient: Joanna Gross  Procedure(s) Performed: Radio Frequency Ablation with Sonata (N/A Vagina ) DILATATION AND CURETTAGE /HYSTEROSCOPY WITH MYOSURE (N/A Vagina )     Patient location during evaluation: PACU Anesthesia Type: General Level of consciousness: awake Pain management: pain level controlled Vital Signs Assessment: post-procedure vital signs reviewed and stable Respiratory status: spontaneous breathing, nonlabored ventilation, respiratory function stable and patient connected to nasal cannula oxygen Cardiovascular status: blood pressure returned to baseline and stable Postop Assessment: no apparent nausea or vomiting Anesthetic complications: no Comments: Patient complaining of pain in the left shoulder and axilla. Areas are tender to palpation. Vital signs stable. ECG ordered and the result was reviewed. Recommend discharge to home.    No complications documented.  Last Vitals:  Vitals:   10/06/20 1400 10/06/20 1459  BP:  108/68  Pulse: 81 61  Resp: 18 16  Temp: 36.7 C 36.7 C  SpO2: 98% 98%    Last Pain:  Vitals:   10/06/20 1459  TempSrc:   PainSc: 0-No pain                 Davian Hanshaw P Ardelle Haliburton

## 2020-10-06 NOTE — H&P (Addendum)
55 y.o. female. G1P0010 Divorced  RP: HSC/Sonata procedure/possible Myosure Excision/D+C  HPI: No change x last visit.  Menorrhagia with secondary anemia.  Uterine Fibroids with a SM fibroid.  Pertinent Gynecological History: Menses: Menometrorrhagia Contraception: Micronor/Abstinence Blood transfusions: none Sexually transmitted diseases: no past history Last mammogram: normal  Last pap: normal     Menstrual History: Patient's last menstrual period was 09/29/2020 (exact date).    Past Medical History:  Diagnosis Date  . Fatigue associated with anemia   . History of seizure 1975   per pt had one seizure in 1975, put on medication until 1988, pt stated no seizure since  . IDA (iron deficiency anemia)    followed by dr Irene Limbo (hematology)---  chronic blood loss from uterine fibroids,  treatment iron infusions  . Menometrorrhagia   . Pelvic pain   . SOB (shortness of breath) on exertion    per pt due to anemia  . Uterine fibroid     Past Surgical History:  Procedure Laterality Date  . BREAST SURGERY Bilateral 1987;  1992;  2009   excision mass biopsy, pt stated benign  . BUNIONECTOMY Bilateral 1999  . CYST EXCISION  1974   left side of neck,  per pt benign  . CYSTECTOMY  1974   left side of neck   . MYOMECTOMY ABDOMINAL APPROACH  2003;  2015    Family History  Problem Relation Age of Onset  . Arthritis Mother   . Hypertension Father   . Diabetes Father     Social History:  reports that she has never smoked. She has never used smokeless tobacco. She reports previous alcohol use. She reports that she does not use drugs.  Allergies:  Allergies  Allergen Reactions  . Amoxicillin Anaphylaxis    Shortness of breath, boils Has patient had a PCN reaction causing immediate rash, facial/tongue/throat swelling, SOB or lightheadedness with hypotension: Yes Has patient had a PCN reaction causing severe rash involving mucus membranes or skin necrosis: Yes Has patient  had a PCN reaction that required hospitalization: Yes Has patient had a PCN reaction occurring within the last 10 years: No If all of the above answers are "NO", then may proceed with Cephalosporin use.   . Erythromycin Shortness Of Breath    Shortness of breath, boils  . Furosemide Anaphylaxis  . Penicillins Anaphylaxis    Shortness of breath, boils  Has patient had a PCN reaction causing immediate rash, facial/tongue/throat swelling, SOB or lightheadedness with hypotension: Yes Has patient had a PCN reaction causing severe rash involving mucus membranes or skin necrosis: Yes Has patient had a PCN reaction that required hospitalization: Yes Has patient had a PCN reaction occurring within the last 10 years: No If all of the above answers are "NO", then may proceed with Cephalosporin use.   . Bee Venom Swelling  . Eucalyptus Flavor [Flavoring Agent]     Hoarse voice, lip burning    Medications Prior to Admission  Medication Sig Dispense Refill Last Dose  . cholecalciferol (VITAMIN D3) 25 MCG (1000 UNIT) tablet Take 1,000 Units by mouth daily.   10/05/2020 at Unknown time  . Cyanocobalamin (B-12 PO) Take by mouth daily.   10/05/2020 at Unknown time  . lactobacillus acidophilus (BACID) TABS tablet Take 2 tablets by mouth 2 (two) times daily.   10/05/2020 at Unknown time  . norethindrone (MICRONOR) 0.35 MG tablet Take 1 tablet (0.35 mg total) by mouth daily. (Patient taking differently: Take 1 tablet by mouth at bedtime.) 84  tablet 4 10/05/2020 at Unknown time  . Omega-3 Fatty Acids (FISH OIL) 1000 MG CAPS Take by mouth daily.   10/05/2020 at Unknown time  . co-enzyme Q-10 30 MG capsule Take 30 mg by mouth 3 (three) times daily.   More than a month at Unknown time  . ELDERBERRY PO Take by mouth.   More than a month at Unknown time    REVIEW OF SYSTEMS: A ROS was performed and pertinent positives and negatives are included in the history.  GENERAL: No fevers or chills. HEENT: No change in vision, no  earache, sore throat or sinus congestion. NECK: No pain or stiffness. CARDIOVASCULAR: No chest pain or pressure. No palpitations. PULMONARY: No shortness of breath, cough or wheeze. GASTROINTESTINAL: No abdominal pain, nausea, vomiting or diarrhea, melena or bright red blood per rectum. GENITOURINARY: No urinary frequency, urgency, hesitancy or dysuria. MUSCULOSKELETAL: No joint or muscle pain, no back pain, no recent trauma. DERMATOLOGIC: No rash, no itching, no lesions. ENDOCRINE: No polyuria, polydipsia, no heat or cold intolerance. No recent change in weight. HEMATOLOGICAL: No anemia or easy bruising or bleeding. NEUROLOGIC: No headache, seizures, numbness, tingling or weakness. PSYCHIATRIC: No depression, no loss of interest in normal activity or change in sleep pattern.     Height 5' 5.5" (1.664 m), weight 107 kg, last menstrual period 09/29/2020.  Physical Exam:  See office notes   Results for orders placed or performed during the hospital encounter of 10/06/20 (from the past 24 hour(s))  Pregnancy, urine POC     Status: None   Collection Time: 10/06/20  6:51 AM  Result Value Ref Range   Preg Test, Ur NEGATIVE NEGATIVE   Gyn exam deferred  [facility] Hysterogram ( procedure note) in 05/2020:  The initial transvaginal ultrasound demonstrated the following: T/Aand T/V images.Grossly enlarged uterus with multiple fibroids, largest are measured at 4.19,3.58,3.73,3.16and5.17 cm.The overall uterine size is measured at 14.83 x 11.53 x 7.76 cm. The endometrial cavity is distorted by surrounding fibroids. The endometrial lining is measured at 8.51 mm. Both ovaries are small with atrophic appearance. No free fluid in the posterior cul-de-sac.  The speculum was inserted and the cervix cleansed with Betadine solution after confirming that patient has no allergies.A small sonohysterography catheterwas utilized. Insertion was facilitated with ring forceps, using a spear-like  motion the catheter was inserted to the fundus of the uterus. The speculum is then removed carefully to avoid dislodging the catheter. The catheter was flushed with sterile saline delete prior to insertion to rid it of small amounts of air.the sterile saline solution was infused into the uterine cavity as a vaginal ultrasound probe was then placed in the vagina for full visualization of the uterine cavity from a transvaginal approach. The following was noted: Saline infusion reveals a 4 cm submucosal fibroid as well as an adjacent 2.8 cm fibroid partially protruding into the uterine cavity.  The catheter was then removed after retrieving some of the saline from the intrauterine cavity. An endometrial biopsywas notdone. Patient tolerated procedure well. She had received a tablet of Aleve for discomfort.   Hb 10/06/2020 at 11.9 Covid Neg   Assessment/Plan:  55 y.o. G1P0010   1. Fibroids Enlarged uterus with many fibroids including 2 fibroids with a significant submucosal component.  Hysteroscopy with Sonata procedure and possible MyoSure excision of fibroids and D&C scheduled.  Surgical procedure including preop preparation, surgical risks and postop precautions thoroughly reviewed.  Patient voiced understanding and agreement with plan.  2. Submucous uterine fibroid  As above.  3. Menorrhagia with regular cycle We will continue on the progestin only birth control pill until surgery.  4. Iron deficiency anemia due to chronic blood loss Iron supplementation to continue.                        Patient was counseled as to the risk of surgery to include the following:  1. Infection (prohylactic antibiotics will be administered)  2. DVT/Pulmonary Embolism (prophylactic pneumo compression stockings will be used)  3.Trauma to internal organs requiring additional surgical procedure to repair any injury to internal organs requiring perhaps additional hospitalization days.  4.Hemmorhage  requiring transfusion and blood products which carry risks such as anaphylactic reaction, hepatitis and AIDS  Patient had received literature information on the procedure scheduled and all her questions were answered and fully accepts all risk.   Joanna Gross 10/06/2020, 7:17 AM

## 2020-10-06 NOTE — Op Note (Signed)
  Operative Note  10/06/2020  2:45 PM  PATIENT:  Joanna Gross  55 y.o. female  PRE-OPERATIVE DIAGNOSIS: Intramural and submucosal fibroids,menorrhagia, anemia  POST-OPERATIVE DIAGNOSIS:  Intramural and submucosal fibroids,menorrhagia, anemia  PROCEDURE:  Procedure(s): Radio Frequency Ablation with Sonata DILATATION AND CURETTAGE /HYSTEROSCOPY WITH MYOSURE EXCISION OF FIBROIDS  SURGEON:  Surgeon(s): Princess Bruins, MD  ANESTHESIA:   general  FINDINGS: Multiple intramural and subserosal uterine fibroids  DESCRIPTION OF OPERATION: Under general anesthesia with laryngeal mask the patient is in lithotomy position.  Patient is prepped with Betadine on the suprapubic, vulvar and vaginal areas.  She is draped as usual.  Timeout is done.  The vaginal exam reveals an anteverted uterus nodular and enlarged with fibroids, mobile.  No adnexal mass.  The speculum is inserted in the vagina and the anterior lip of the cervix is grasped with a tenaculum.  The hysterometry is about 10 cm.  Dilation of the cervix with Pratt dilators up to #21 without difficulty.  The hysteroscope is inserted in the intrauterine cavity.  A mostly submucosal fibroid is visualized in the intra uterine cavity measuring about 5 cm.  The hysteroscope is removed.  An endometrial curettage is done with a sharp curette.  We then inserted the Sonata instrument in the intra uterine cavity.  We sweep all around to locate and measured the uterine fibroids.  7 ablations were performed on 5 fibroids.  The first fibroid was 3.5 cm a type III fibroid at 6:00.  The first ablation was 4.6 x 3.7 for 6 minutes and 13 seconds.  The second ablation was for by 3.2 cm for 5 minutes and 6 seconds.  The third ablation was 3.8 x 3.1 cm for 4 minutes and 48 seconds.  Fibroid #2 was 3.5 cm a type III-IV at 7-8 o'clock.  The ablation was 4.3 x 3.5 cm for 5 minutes and 42 seconds.  The third fibroid was 5 cm in diameter a type I-II at 1:00.  The ablation  was 4.8 x 4.1 cm for 6 minutes and 8 seconds.  Fibroid #4 was 3.5 cm a type III at 9-10 o'clock.  The ablation was 4.3 x 3.5 cm for 5 minutes and 42 seconds.  The fifth fibroid was 3 cm a type II at 2:00.  The ablation was 3.9 x 3.1 cm for 4 minutes and 46 seconds.  We then went back with the hysteroscope and used MyoSure XL to excise the submucosal part of the fibroid which was now showing burnt hairy areas and was smaller than at the beginning.  This fibroid was partially excised because we were reaching maximum fluid deficit.  We then removed the hysteroscope with the MyoSure instrument.  The tenaculum was removed from the cervix.  Pictures were taken before and after the procedures.  Hemostasis was adequate.  The all specimens were sent together to pathology.  The patient was brought to recovery room in good and stable status.  ESTIMATED BLOOD LOSS: 50 mL   Intake/Output Summary (Last 24 hours) at 10/06/2020 1445 Last data filed at 10/06/2020 1410 Gross per 24 hour  Intake 1860 ml  Output 1050 ml  Net 810 ml     BLOOD ADMINISTERED:none   LOCAL MEDICATIONS USED: Nesacaine 1% 20 cc  SPECIMEN:  Source of Specimen:  Endometrial tissue and excision specimen from submucosal fibroids  DISPOSITION OF SPECIMEN:  PATHOLOGY  COUNTS:  YES  PLAN OF CARE: Transfer to PACU  Marie-Lyne LavoieMD2:45 PM

## 2020-10-06 NOTE — Transfer of Care (Signed)
Immediate Anesthesia Transfer of Care Note  Patient: MAIGAN BITTINGER  Procedure(s) Performed: Radio Frequency Ablation with Read Drivers (N/A Vagina ) DILATATION AND CURETTAGE /HYSTEROSCOPY WITH MYOSURE (N/A Vagina )  Patient Location: PACU  Anesthesia Type:General  Level of Consciousness: drowsy  Airway & Oxygen Therapy: Patient Spontanous Breathing and Patient connected to nasal cannula oxygen  Post-op Assessment: Report given to RN  Post vital signs: Reviewed and stable  Last Vitals:  Vitals Value Taken Time  BP 124/77 10/06/20 1215  Temp    Pulse 97 10/06/20 1216  Resp 18 10/06/20 1216  SpO2 100 % 10/06/20 1216  Vitals shown include unvalidated device data.  Last Pain:  Vitals:   10/06/20 0716  TempSrc: Oral  PainSc: 0-No pain      Patients Stated Pain Goal: 7 (28/63/81 [id number])  Complications: No complications documented.

## 2020-10-06 NOTE — Discharge Instructions (Signed)
Hysteroscopy Hysteroscopy is a procedure used to look inside a woman's womb (uterus). This may be done for various reasons, including:  To look for tumors and other growths in the uterus.  To evaluate abnormal bleeding, fibroid tumors, polyps, scar tissue, or uterine cancer.  To determine why a woman is unable to get pregnant or has had repeated pregnancy losses.  To locate an IUD (intrauterine device).  To place a birth control device into the fallopian tubes. During this procedure, a thin, flexible tube with a small light and camera (hysteroscope) is used to examine the uterus. The camera sends images to a monitor in the room so that your health care provider can view the inside of your uterus. A hysteroscopy should be done right after a menstrual period. Tell a health care provider about:  Any allergies you have.  All medicines you are taking, including vitamins, herbs, eye drops, creams, and over-the-counter medicines.  Any problems you or family members have had with anesthetic medicines.  Any blood disorders you have.  Any surgeries you have had.  Any medical conditions you have.  Whether you are pregnant or may be pregnant.  Whether you have been diagnosed with an STI (sexually transmitted infection) or you think you have an STI. What are the risks? Generally, this is a safe procedure. However, problems may occur, including:  Excessive bleeding.  Infection.  Damage to the uterus or other structures or organs.  Allergic reaction to medicines or fluids that are used in the procedure. What happens before the procedure? Staying hydrated Follow instructions from your health care provider about hydration, which may include:  Up to 2 hours before the procedure - you may continue to drink clear liquids, such as water, clear fruit juice, black coffee, and plain tea. Eating and drinking restrictions Follow instructions from your health care provider about eating and  drinking, which may include:  8 hours before the procedure - stop eating solid foods and drink clear liquids only.  2 hours before the procedure - stop drinking clear liquids. Medicines  Ask your health care provider about: ? Changing or stopping your regular medicines. This is especially important if you are taking diabetes medicines or blood thinners. ? Taking medicines such as aspirin and ibuprofen. These medicines can thin your blood. Do not take these medicines unless your health care provider tells you to take them. ? Taking over-the-counter medicines, vitamins, herbs, and supplements.  Medicine may be placed in your cervix the day before the procedure. This medicine causes the cervix to open (dilate). The larger opening makes it easier for the hysteroscope to be inserted into the uterus during the procedure. General instructions  Ask your health care provider: ? What steps will be taken to help prevent infection. These steps may include:  Washing skin with a germ-killing soap.  Taking antibiotic medicine.  Do not use any products that contain nicotine or tobacco for at least 4 weeks before the procedure. These products include cigarettes, chewing tobacco, and vaping devices, such as e-cigarettes. If you need help quitting, ask your health care provider.  Plan to have a responsible adult take you home from the hospital or clinic.  Plan to have a responsible adult care for you for the time you are told after you leave the hospital or clinic. This is important.  Empty your bladder before the procedure begins. What happens during the procedure?  An IV will be inserted into one of your veins.  You may be given: ?  A medicine to help you relax (sedative). ? A medicine that numbs the area around the cervix (local anesthetic). ? A medicine to make you fall asleep (general anesthetic).  A hysteroscope will be inserted through your vagina and into your uterus.  Air or fluid will  be used to enlarge your uterus to allow your health care provider to see it better. The amount of fluid used will be carefully checked throughout the procedure.  In some cases, tissue may be gently scraped from inside the uterus and sent to a lab for testing (biopsy). The procedure may vary among health care providers and hospitals. What can I expect after the procedure?  Your blood pressure, heart rate, breathing rate, and blood oxygen level will be monitored until you leave the hospital or clinic.  You may have cramps. You may be given medicines for this.  You may have bleeding, which may vary from light spotting to menstrual-like bleeding. This is normal.  If you had a biopsy, it is up to you to get the results. Ask your health care provider, or the department that is doing the procedure, when your results will be ready. Follow these instructions at home: Activity  Rest as told by your health care provider.  Return to your normal activities as told by your health care provider. Ask your health care provider what activities are safe for you.  If you were given a sedative during the procedure, it can affect you for several hours. Do not drive or operate machinery until your health care provider says that it is safe. Medicines  Do not take aspirin or other NSAIDs during recovery, as told by your healthcare provider. It can increase the risk of bleeding.  Ask your health care provider if the medicine prescribed to you: ? Requires you to avoid driving or using machinery. ? Can cause constipation. You may need to take these actions to prevent or treat constipation:  Drink enough fluid to keep your urine pale yellow.  Take over-the-counter or prescription medicines.  Eat foods that are high in fiber, such as beans, whole grains, and fresh fruits and vegetables.  Limit foods that are high in fat and processed sugars, such as fried or sweet foods. General instructions  Do not douche,  use tampons, or have sex for 2 weeks after the procedure, or until your health care provider approves.  Do not take baths, swim, or use a hot tub until your health care provider approves. Take showers instead of baths for 2 weeks, or for as long as told by your health care provider.  Keep all follow-up visits. This is important. Contact a health care provider if:  You feel dizzy or lightheaded.  You feel nauseous.  You have abnormal vaginal discharge.  You have a rash.  You have pain that does not get better with medicine.  You have chills. Get help right away if:  You have bleeding that is heavier than a normal menstrual period.  You have a fever.  You have pain or cramps that get worse.  You develop new abdominal pain.  You faint.  You have pain in your shoulder.  You are short of breath. Summary  Hysteroscopy is a procedure that is used to look inside a woman's womb (uterus).  After the procedure, you may have bleeding, which varies from light spotting to menstrual-like bleeding. This is normal. You may also have cramps.  Do not douche, use tampons, or have sex for 2 weeks after  the procedure, or until your health care provider approves.  Plan to have a responsible adult take you home from the hospital or clinic. This information is not intended to replace advice given to you by your health care provider. Make sure you discuss any questions you have with your health care provider. Document Revised: 02/05/2020 Document Reviewed: 02/05/2020 Elsevier Patient Education  2021 [employer] Instructions  Activity: Get plenty of rest for the remainder of the day. A responsible individual must stay with you for 24 hours following the procedure.  For the next 24 hours, DO NOT: -Drive a car -Paediatric nurse -Drink alcoholic beverages -Take any medication unless instructed by your physician -Make any legal decisions or sign important  papers.  Meals: Start with liquid foods such as gelatin or soup. Progress to regular foods as tolerated. Avoid greasy, spicy, heavy foods. If nausea and/or vomiting occur, drink only clear liquids until the nausea and/or vomiting subsides. Call your physician if vomiting continues.  Special Instructions/Symptoms: Your throat may feel dry or sore from the anesthesia or the breathing tube placed in your throat during surgery. If this causes discomfort, gargle with warm salt water. The discomfort should disappear within 24 hours.  If you had a scopolamine patch placed behind your ear for the management of post- operative nausea and/or vomiting:  1. The medication in the patch is effective for 72 hours, after which it should be removed.  Wrap patch in a tissue and discard in the trash. Wash hands thoroughly with soap and water. 2. You may remove the patch earlier than 72 hours if you experience unpleasant side effects which may include dry mouth, dizziness or visual disturbances. 3. Avoid touching the patch. Wash your hands with soap and water after contact with the patch.    DISCHARGE INSTRUCTIONS: D&C / D&E The following instructions have been prepared to help you care for yourself upon your return home.   Personal hygiene: Marland Kitchen Use sanitary pads for vaginal drainage, not tampons. . Shower the day after your procedure. . NO tub baths, pools or Jacuzzis for 2-3 weeks. . Wipe front to back after using the bathroom.  Activity and limitations: . Do NOT drive or operate any equipment for 24 hours. The effects of anesthesia are still present and drowsiness may result. . Do NOT rest in bed all day. . Walking is encouraged. . Walk up and down stairs slowly. . You may resume your normal activity in one to two days or as indicated by your physician.  Sexual activity: NO intercourse for at least 2 weeks after the procedure, or as indicated by your physician.  Diet: Eat a light meal as desired this  evening. You may resume your usual diet tomorrow.  Return to work: You may resume your work activities in one to two days or as indicated by your doctor.  What to expect after your surgery: Expect to have vaginal bleeding/discharge for 2-3 days and spotting for up to 10 days. It is not unusual to have soreness for up to 1-2 weeks. You may have a slight burning sensation when you urinate for the first day. Mild cramps may continue for a couple of days. You may have a regular period in 2-6 weeks.  Call your doctor for any of the following: . Excessive vaginal bleeding, saturating and changing one pad every hour. . Inability to urinate 6 hours after discharge from hospital. . Pain not relieved by pain medication. . Fever of  100.4 F or greater. . Unusual vaginal discharge or odor.   Call for an appointment:

## 2020-10-06 NOTE — Anesthesia Procedure Notes (Signed)
Procedure Name: Intubation Date/Time: 10/06/2020 8:42 AM Performed by: Bonney Aid, CRNA Pre-anesthesia Checklist: Patient identified, Emergency Drugs available, Suction available and Patient being monitored Patient Re-evaluated:Patient Re-evaluated prior to induction Oxygen Delivery Method: Circle system utilized Preoxygenation: Pre-oxygenation with 100% oxygen Induction Type: IV induction Ventilation: Mask ventilation without difficulty Laryngoscope Size: Mac and 3 Grade View: Grade I Tube type: Oral Number of attempts: 1 Airway Equipment and Method: Stylet and LTA kit utilized Placement Confirmation: ETT inserted through vocal cords under direct vision,  positive ETCO2 and breath sounds checked- equal and bilateral Secured at: 21 cm Tube secured with: Tape Dental Injury: Teeth and Oropharynx as per pre-operative assessment

## 2020-10-07 ENCOUNTER — Encounter (HOSPITAL_BASED_OUTPATIENT_CLINIC_OR_DEPARTMENT_OTHER): Payer: Self-pay | Admitting: Obstetrics & Gynecology

## 2020-10-07 LAB — SURGICAL PATHOLOGY

## 2020-10-07 NOTE — Addendum Note (Signed)
Addendum  created 10/07/20 1320 by Suan Halter, CRNA   Charge Capture section accepted

## 2020-10-08 NOTE — Telephone Encounter (Signed)
Encounter closed. Surgery on 10-06-20.

## 2020-10-19 ENCOUNTER — Encounter: Payer: Self-pay | Admitting: Nurse Practitioner

## 2020-10-19 ENCOUNTER — Telehealth: Payer: Self-pay

## 2020-10-19 ENCOUNTER — Ambulatory Visit (INDEPENDENT_AMBULATORY_CARE_PROVIDER_SITE_OTHER): Payer: PRIVATE HEALTH INSURANCE | Admitting: Nurse Practitioner

## 2020-10-19 ENCOUNTER — Encounter (HOSPITAL_COMMUNITY): Payer: Self-pay

## 2020-10-19 ENCOUNTER — Emergency Department (HOSPITAL_COMMUNITY): Payer: PRIVATE HEALTH INSURANCE

## 2020-10-19 ENCOUNTER — Emergency Department (HOSPITAL_COMMUNITY)
Admission: EM | Admit: 2020-10-19 | Discharge: 2020-10-19 | Disposition: A | Discharge status: Home / Self Care | Payer: PRIVATE HEALTH INSURANCE | Attending: Emergency Medicine | Admitting: Emergency Medicine

## 2020-10-19 ENCOUNTER — Other Ambulatory Visit: Payer: Self-pay

## 2020-10-19 VITALS — BP 110/74 | HR 74 | Temp 98.5°F | Resp 16 | Wt 244.0 lb

## 2020-10-19 DIAGNOSIS — G8918 Other acute postprocedural pain: Secondary | ICD-10-CM

## 2020-10-19 DIAGNOSIS — R102 Pelvic and perineal pain: Secondary | ICD-10-CM

## 2020-10-19 DIAGNOSIS — D259 Leiomyoma of uterus, unspecified: Secondary | ICD-10-CM | POA: Diagnosis not present

## 2020-10-19 DIAGNOSIS — R1031 Right lower quadrant pain: Secondary | ICD-10-CM | POA: Diagnosis present

## 2020-10-19 LAB — CBC
HCT: 40.4 % (ref 36.0–46.0)
Hemoglobin: 12.9 g/dL (ref 12.0–15.0)
MCH: 28.4 pg (ref 26.0–34.0)
MCHC: 31.9 g/dL (ref 30.0–36.0)
MCV: 88.8 fL (ref 80.0–100.0)
Platelets: 146 10*3/uL — ABNORMAL LOW (ref 150–400)
RBC: 4.55 MIL/uL (ref 3.87–5.11)
WBC: 6.6 10*3/uL (ref 4.0–10.5)
nRBC: 0 % (ref 0.0–0.2)

## 2020-10-19 LAB — COMPREHENSIVE METABOLIC PANEL
ALT: 40 U/L (ref 0–44)
AST: 20 U/L (ref 15–41)
Albumin: 4.4 g/dL (ref 3.5–5.0)
Alkaline Phosphatase: 70 U/L (ref 38–126)
Anion gap: 10 (ref 5–15)
BUN: 9 mg/dL (ref 6–20)
CO2: 24 mmol/L (ref 22–32)
Calcium: 9.9 mg/dL (ref 8.9–10.3)
Chloride: 104 mmol/L (ref 98–111)
Creatinine, Ser: 0.61 mg/dL (ref 0.44–1.00)
GFR, Estimated: >60 mL/min (ref >60)
Glucose, Bld: 91 mg/dL (ref 70–99)
Potassium: 3.5 mmol/L (ref 3.5–5.1)
Sodium: 138 mmol/L (ref 135–145)
Total Bilirubin: 0.8 mg/dL (ref 0.3–1.2)
Total Protein: 8.3 g/dL — ABNORMAL HIGH (ref 6.5–8.1)

## 2020-10-19 LAB — LIPASE, BLOOD: Lipase: 26 U/L (ref 11–51)

## 2020-10-19 MED ORDER — SODIUM CHLORIDE 0.9 % IV BOLUS (SEPSIS)
1000.0000 mL | Freq: Once | INTRAVENOUS | Status: AC
  Administered 2020-10-19: 1000 mL via INTRAVENOUS

## 2020-10-19 MED ORDER — HYDROCODONE-ACETAMINOPHEN 5-325 MG PO TABS: 1.0000 | ORAL_TABLET | Freq: Four times a day (QID) | ORAL | 0 refills | Status: DC | PRN

## 2020-10-19 MED ORDER — ONDANSETRON HCL 4 MG/2ML IJ SOLN
4.0000 mg | Freq: Once | INTRAMUSCULAR | Status: AC
  Administered 2020-10-19: 4 mg via INTRAVENOUS
  Filled 2020-10-19 (×2): qty 2

## 2020-10-19 MED ORDER — SODIUM CHLORIDE 0.9 % IV SOLN: 1000.0000 mL | INTRAVENOUS | Status: DC

## 2020-10-19 MED ORDER — IOHEXOL 300 MG/ML  SOLN
100.0000 mL | Freq: Once | INTRAMUSCULAR | Status: AC | PRN
  Administered 2020-10-19: 100 mL via INTRAVENOUS

## 2020-10-19 MED ORDER — KETOROLAC TROMETHAMINE 30 MG/ML IJ SOLN
30.0000 mg | Freq: Once | INTRAMUSCULAR | Status: AC
  Administered 2020-10-19: 30 mg via INTRAVENOUS
  Filled 2020-10-19: qty 1

## 2020-10-19 MED ORDER — ONDANSETRON 8 MG PO TBDP: 8.0000 mg | ORAL_TABLET | Freq: Three times a day (TID) | ORAL | 0 refills | Status: DC | PRN

## 2020-10-19 MED ORDER — HYDROMORPHONE HCL 1 MG/ML IJ SOLN
1.0000 mg | Freq: Once | INTRAMUSCULAR | Status: AC
  Administered 2020-10-19: 1 mg via INTRAVENOUS
  Filled 2020-10-19 (×2): qty 1

## 2020-10-19 MED ORDER — NAPROXEN 500 MG PO TABS: 500.0000 mg | ORAL_TABLET | Freq: Two times a day (BID) | ORAL | 0 refills | Status: DC

## 2020-10-19 NOTE — ED Provider Notes (Signed)
[facility] DEPT Provider Note   CSN: [id number] Arrival date & time: 10/19/20  1525     History Chief Complaint  Patient presents with  . Abdominal Pain    Joanna Gross is a 55 y.o. female.  HPI   Patient presents to the ED for evaluation of fever and right lower quadrant abdominal pain.  Patient had fibroid surgery on April 5.  She states she has been doing well into the last couple of days.  She started having pain that was increasing in her right lower abdomen.  Pain increases with movement and palpation.  She also had a fever up to 102.4.  Patient's had nausea and decreased appetite.  She went to see her OB/GYN doctor today and had a pelvic exam.  They were concerned about the possibility of appendicitis and suggested she go to the ED for further evaluation.  Past Medical History:  Diagnosis Date  . Fatigue associated with anemia   . History of seizure 1975   per pt had one seizure in 1975, put on medication until 1988, pt stated no seizure since  . IDA (iron deficiency anemia)    followed by dr Irene Limbo (hematology)---  chronic blood loss from uterine fibroids,  treatment iron infusions  . Menometrorrhagia   . Pelvic pain   . Seizures ([place])   . SOB (shortness of breath) on exertion    per pt due to anemia  . Uterine fibroid     Patient Active Problem List   Diagnosis Date Noted  . Iron deficiency anemia 03/27/2020  . Symptomatic anemia 03/26/2018  . Left breast mass 05/24/2011  . CHEST PAIN 05/16/2007    Past Surgical History:  Procedure Laterality Date  . BREAST SURGERY Bilateral 1987;  1992;  2009   excision mass biopsy, pt stated benign  . BUNIONECTOMY Bilateral 1999  . CYST EXCISION  1974   left side of neck,  per pt benign  . CYSTECTOMY  1974   left side of neck   . HYSTEROSCOPY WITH D & C N/A 10/06/2020   Procedure: DILATATION AND CURETTAGE /HYSTEROSCOPY WITH MYOSURE;  Surgeon: Princess Bruins, MD;  Location: [facility];  Service: Gynecology;  Laterality: N/A;  . MYOMECTOMY ABDOMINAL APPROACH  2003;  2015     OB History    Gravida  1   Para  0   Term      Preterm      AB  1   Living  0     SAB      IAB      Ectopic      Multiple      Live Births              Family History  Problem Relation Age of Onset  . Arthritis Mother   . Hypertension Father   . Diabetes Father     Social History   Tobacco Use  . Smoking status: Never Smoker  . Smokeless tobacco: Never Used  Vaping Use  . Vaping Use: Never used  Substance Use Topics  . Alcohol use: Not Currently  . Drug use: Never    Home Medications Prior to Admission medications   Medication Sig Start Date End Date Taking? Authorizing Provider  HYDROcodone-acetaminophen (NORCO/VICODIN) 5-325 MG tablet Take 1 tablet by mouth every 6 (six) hours as needed. 10/19/20  Yes Dorie Rank, MD  naproxen (NAPROSYN) 500 MG tablet Take 1 tablet (500 mg total) by mouth  2 (two) times daily with a meal. As needed for pain 10/19/20  Yes Dorie Rank, MD  ondansetron (ZOFRAN ODT) 8 MG disintegrating tablet Take 1 tablet (8 mg total) by mouth every 8 (eight) hours as needed for nausea or vomiting. 10/19/20  Yes Dorie Rank, MD  acetaminophen-codeine (TYLENOL #3) 300-30 MG tablet  03/23/20   [provider]  B Complex-C (SUPER B COMPLEX/VITAMIN C) TABS  04/01/20   [provider]  cholecalciferol (VITAMIN D3) 25 MCG (1000 UNIT) tablet Take 1,000 Units by mouth daily.    [provider]  co-enzyme Q-10 30 MG capsule Take 30 mg by mouth 3 (three) times daily. Patient not taking: Reported on 10/19/2020    [provider]  IBUPROFEN PO Take 600 mg by mouth.    [provider]  lactobacillus acidophilus (BACID) TABS tablet Take 2 tablets by mouth 2 (two) times daily.    [provider]  Omega-3 Fatty Acids (FISH OIL) 1000 MG CAPS Take by mouth daily.    [provider]    Allergies     Amoxicillin, Erythromycin, Furosemide, Penicillins, Bee venom, and Eucalyptus flavor [flavoring agent]  Review of Systems   Review of Systems  All other systems reviewed and are negative.   Physical Exam Updated Vital Signs BP 127/73   Pulse 84   Temp 98.3 F (36.8 C) (Oral)   Resp 15   Ht 1.664 m (5' 5.5")   Wt 108.9 kg   LMP 09/29/2020 (Exact Date)   SpO2 97%   BMI 39.33 kg/m   Physical Exam Vitals and nursing note reviewed.  Constitutional:      General: She is not in acute distress.    Appearance: She is well-developed.  HENT:     Head: Normocephalic and atraumatic.     Right Ear: External ear normal.     Left Ear: External ear normal.  Eyes:     General: No scleral icterus.       Right eye: No discharge.        Left eye: No discharge.     Conjunctiva/sclera: Conjunctivae normal.  Neck:     Trachea: No tracheal deviation.  Cardiovascular:     Rate and Rhythm: Normal rate and regular rhythm.  Pulmonary:     Effort: Pulmonary effort is normal. No respiratory distress.     Breath sounds: Normal breath sounds. No stridor. No wheezing or rales.  Abdominal:     General: Bowel sounds are normal. There is no distension.     Palpations: Abdomen is soft.     Tenderness: There is abdominal tenderness in the right lower quadrant. There is guarding. There is no rebound.  Musculoskeletal:        General: No tenderness.     Cervical back: Neck supple.  Skin:    General: Skin is warm and dry.     Findings: No rash.  Neurological:     Mental Status: She is alert.     Cranial Nerves: No cranial nerve deficit (no facial droop, extraocular movements intact, no slurred speech).     Sensory: No sensory deficit.     Motor: No abnormal muscle tone or seizure activity.     Coordination: Coordination normal.     ED Results / Procedures / Treatments   Labs (all labs ordered are listed, but only abnormal results are displayed) Labs Reviewed  COMPREHENSIVE METABOLIC PANEL -  Abnormal; Notable for the following components:      Result  Value   Total Protein 8.3 (*)    All other components within normal limits  CBC - Abnormal; Notable for the following components:   Platelets 146 (*)    All other components within normal limits  LIPASE, BLOOD  URINALYSIS, ROUTINE W REFLEX MICROSCOPIC    EKG None  Radiology CT ABDOMEN PELVIS W CONTRAST  Result Date: 10/19/2020 CLINICAL DATA:  Right lower quadrant pain, history of recent uterine fibroid surgery with persistent hemorrhage. EXAM: CT ABDOMEN AND PELVIS WITH CONTRAST TECHNIQUE: Multidetector CT imaging of the abdomen and pelvis was performed using the standard protocol following bolus administration of intravenous contrast. CONTRAST:  127mL OMNIPAQUE IOHEXOL 300 MG/ML  SOLN COMPARISON:  03/27/2018 FINDINGS: Lower chest: No acute abnormality. Hepatobiliary: Fatty infiltration of the liver is noted. The gallbladder is well distended with a single dependent gallstone. No wall thickening or pericholecystic fluid is noted. Pancreas: Unremarkable. No pancreatic ductal dilatation or surrounding inflammatory changes. Spleen: Normal in size without focal abnormality. Adrenals/Urinary Tract: Adrenal glands are unremarkable. Kidneys demonstrate normal enhancement and normal excretion. No definitive renal calculi are seen. No obstructive changes are noted. The bladder is partially distended. Stomach/Bowel: No obstructive or inflammatory changes of the colon are seen. The appendix is well visualized and within normal limits. Small bowel and stomach appear within normal limits. Vascular/Lymphatic: No significant vascular findings are present. No enlarged abdominal or pelvic lymph nodes. Reproductive: Uterus is well visualized and significantly enlarged (at least 11 x 11 cm in greatest dimension) with multifocal lesions within with evidence of central necrosis consistent with the known history of uterine fibroids. Some air is noted within  likely related to the known recent surgery. No adnexal abnormality is noted. No pooling of contrast to suggest acute hemorrhage is noted at this time. Other: No abdominal wall hernia or abnormality. No abdominopelvic ascites. Musculoskeletal: No acute or significant osseous findings. IMPRESSION: Enlarged uterus with multiple uterine fibroids. Postsurgical changes are noted. No active extravasation is seen. Normal appendix. Single gallstone without complicating factors. Fatty liver. Electronically Signed   By: Inez Catalina M.D.   On: 10/19/2020 20:52    Procedures Procedures   Medications Ordered in ED Medications  sodium chloride 0.9 % bolus 1,000 mL (1,000 mLs Intravenous New Bag/Given 10/19/20 2018)    Followed by  0.9 %  sodium chloride infusion (has no administration in time range)  ketorolac (TORADOL) 30 MG/ML injection 30 mg (has no administration in time range)  HYDROmorphone (DILAUDID) injection 1 mg (1 mg Intravenous Given 10/19/20 2021)  ondansetron (ZOFRAN) injection 4 mg (4 mg Intravenous Given 10/19/20 2021)  iohexol (OMNIPAQUE) 300 MG/ML solution 100 mL (100 mLs Intravenous Contrast Given 10/19/20 2032)    ED Course  I have reviewed the triage vital signs and the nursing notes.  Pertinent labs & imaging results that were available during my care of the patient were reviewed by me and considered in my medical decision making (see chart for details).  Clinical Course as of 10/19/20 2152  Mon Oct 19, 2020  2133 Patient CT scan does not show any evidence of appendicitis.  Large fibroids noted. [JK]    Clinical Course User Index [JK] Dorie Rank, MD   MDM Rules/Calculators/A&P                          Patient presents to the ED for evaluation of lower abdominal pain after recent surgery on April 5.  Patient had a radioablation of her fibroids  as well as a D&C.  Patient was sent to the ED for evaluation of possible appendicitis or other acute abdominal process.  Patient's  laboratory tests are reassuring.  Her CT scan does not show any evidence of appendicitis.  She does have persistent fibroids.  I suspect her symptoms are likely related to that.  Will discharge home with prescriptions for pain medications.  Discussed outpatient follow-up with her OB/GYN doctor. Final Clinical Impression(s) / ED Diagnoses Final diagnoses:  Uterine leiomyoma, unspecified location    Rx / DC Orders ED Discharge Orders         Ordered    HYDROcodone-acetaminophen (NORCO/VICODIN) 5-325 MG tablet  Every 6 hours PRN        10/19/20 2152    ondansetron (ZOFRAN ODT) 8 MG disintegrating tablet  Every 8 hours PRN        10/19/20 2152    naproxen (NAPROSYN) 500 MG tablet  2 times daily with meals        10/19/20 2152           Dorie Rank, MD 10/19/20 2153

## 2020-10-19 NOTE — ED Triage Notes (Signed)
Emergency Medicine Provider Triage Evaluation Note  Joanna Gross , a 55 y.o. female  was evaluated in triage.  Pt complains of Abdominal pain, had fibroids removed 4/5 and had fever two days ago up to 102.4.  She went to PCP and OB/GYN and was told to come to the ER for concern of appendicitis vs infection.  Saw [facility] who did pelvic and told to come here.   Review of Systems  Positive: Fever, Abdominal pain, N/V Negative: Diarrhea  Physical Exam  BP 136/75 (BP Location: Right Arm)   Pulse 90   Temp 98.6 F (37 C) (Oral)   Resp 16   Ht 5' 5.5" (1.664 m)   Wt 108.9 kg   LMP 09/29/2020 (Exact Date)   SpO2 99%   BMI 39.33 kg/m  Gen:   Awake, no distress   HEENT:  Atraumatic  Resp:  Normal effort  Cardiac:  Normal rate  Abd:   TTP over the lower RLQ  MSK:   Moves extremities without difficulty  Neuro:  Speech clear   Medical Decision Making  Medically screening exam initiated at 4:07 PM.  Appropriate orders placed.  GABBIE MARZO was informed that the remainder of the evaluation will be completed by another provider, this initial triage assessment does not replace that evaluation, and the importance of remaining in the ED until their evaluation is complete.  Clinical Impression  Abdominal pain   Lorin Glass, [place] 10/19/20 1611

## 2020-10-19 NOTE — Discharge Instructions (Signed)
Take medication as needed for pain.  Follow-up with your OB/GYN doctor for further evaluation

## 2020-10-19 NOTE — Telephone Encounter (Signed)
Patient had Sonata procedure with Dr. Dellis Filbert on 10/06/20 and recovery was going well until last Wed 10/14/20 when she began to experience sharp pains in her left abd around umbilicus.  Some mild vaginal pains and eventually some cramping on the Right side as well.  That persisted and she took Advil.  Saturday morning 4/16 she woke to vomiting and later just nausea. She has not really eaten since then. She has no appetite and ate 8 tsps of oatmeal yesterday to be able to take ibuprofen.  She has fever that has fluctuated with highest 102 degrees to 99.1 degrees. She said pain is made worse with trying to stand or bend over.  No UTI symptoms.   Patient will see Karma Ganja, NP at 11:30am.

## 2020-10-19 NOTE — ED Notes (Signed)
Pt to CT via stretcher

## 2020-10-19 NOTE — ED Triage Notes (Signed)
Patient states she had fibroids removed on 10/06/20. Patient states she had a fever 2 days ago from 99.1-102.4. patient states she began having nausea 5 days.   Patient went to her PCP and Ob/GYN. Patient was told to come to the ED to r/o appendicitis  Or an infection.

## 2020-10-19 NOTE — Progress Notes (Signed)
GYNECOLOGY  VISIT  CC:   2 weeks post op s/p Sonata, EMB  HPI: 55 y.o. G50P0010 Divorced Black or [place] American female here for pelvic pain.   Bleeding since surgery, shooting pain Right side getting increasingly worse,  N/V since Saturday, (vomited only twice Saturday and in office visit today), last BM on Saturday (normal)  Temp 102.4 on Saturday, since then no temp Headaches since Thursday,has had a lot of gas Loss appetite, has only been able to eat 6 teaspoons of oatmeal since Saturday. Last Ibuprofen was yesterday, not helping Tylenol last took last night  GYNECOLOGIC HISTORY: Patient's last menstrual period was 09/29/2020 (exact date). Contraception: abstinence Menopausal hormone therapy: none  Patient Active Problem List   Diagnosis Date Noted  . Iron deficiency anemia 03/27/2020  . Symptomatic anemia 03/26/2018  . Left breast mass 05/24/2011  . CHEST PAIN 05/16/2007    Past Medical History:  Diagnosis Date  . Fatigue associated with anemia   . History of seizure 1975   per pt had one seizure in 1975, put on medication until 1988, pt stated no seizure since  . IDA (iron deficiency anemia)    followed by dr Irene Limbo (hematology)---  chronic blood loss from uterine fibroids,  treatment iron infusions  . Menometrorrhagia   . Pelvic pain   . SOB (shortness of breath) on exertion    per pt due to anemia  . Uterine fibroid     Past Surgical History:  Procedure Laterality Date  . BREAST SURGERY Bilateral 1987;  1992;  2009   excision mass biopsy, pt stated benign  . BUNIONECTOMY Bilateral 1999  . CYST EXCISION  1974   left side of neck,  per pt benign  . CYSTECTOMY  1974   left side of neck   . HYSTEROSCOPY WITH D & C N/A 10/06/2020   Procedure: DILATATION AND CURETTAGE /HYSTEROSCOPY WITH MYOSURE;  Surgeon: Princess Bruins, MD;  Location: [facility];  Service: Gynecology;  Laterality: N/A;  . MYOMECTOMY ABDOMINAL APPROACH  2003;  2015    MEDS:    Current Outpatient Medications on File Prior to Visit  Medication Sig Dispense Refill  . acetaminophen-codeine (TYLENOL #3) 300-30 MG tablet     . B Complex-C (SUPER B COMPLEX/VITAMIN C) TABS     . cholecalciferol (VITAMIN D3) 25 MCG (1000 UNIT) tablet Take 1,000 Units by mouth daily.    . IBUPROFEN PO Take 600 mg by mouth.    . lactobacillus acidophilus (BACID) TABS tablet Take 2 tablets by mouth 2 (two) times daily.    . Omega-3 Fatty Acids (FISH OIL) 1000 MG CAPS Take by mouth daily.    Marland Kitchen co-enzyme Q-10 30 MG capsule Take 30 mg by mouth 3 (three) times daily. (Patient not taking: Reported on 10/19/2020)     No current facility-administered medications on file prior to visit.    ALLERGIES: Amoxicillin, Erythromycin, Furosemide, Penicillins, Bee venom, and Eucalyptus flavor [flavoring agent]  Family History  Problem Relation Age of Onset  . Arthritis Mother   . Hypertension Father   . Diabetes Father      Review of Systems  Constitutional: Negative.   HENT: Negative.   Eyes: Negative.   Respiratory: Negative.   Cardiovascular: Negative.   Gastrointestinal: Negative.   Endocrine: Negative.   Genitourinary:       Sharp,shooting pain on rt pelvic area  Musculoskeletal: Negative.   Skin: Negative.   Allergic/Immunologic: Negative.   Neurological: Negative.   Hematological: Negative.  Psychiatric/Behavioral: Negative.     PHYSICAL EXAMINATION:    BP 110/74   Pulse 74   Temp 98.5 F (36.9 C) (Oral)   Resp 16   Wt 244 lb (110.7 kg)   LMP 09/29/2020 (Exact Date)   BMI 39.99 kg/m     General appearance: alert, cooperative, visible discomfort, walking slowly, holding right side of abdomen  Abdomen: Tenderness RLQ with gentle pressure, Bowel Sounds normal  Pelvic: External genitalia:  no lesions              Urethra:  normal appearing urethra with no masses, tenderness or lesions              Bartholins and Skenes: normal                 Vagina: bloody discharge,  unable to locate cervix, pt unable to tolerate exam                Uterus tender, enlarged               Dr. Quincy Simmonds and Karma Ganja were present for exam.  Assessment: Right Lower Quadrant pain   Plan: Pt encouraged to go to [facility] long ER for evaluation Blood initially drawn for CBC, CMP but order canceled once sent to ER

## 2020-10-19 NOTE — ED Notes (Signed)
IV team at bedside 

## 2020-10-21 LAB — URINE CULTURE
MICRO NUMBER:: 11781413
SPECIMEN QUALITY:: ADEQUATE

## 2020-10-21 LAB — URINALYSIS, COMPLETE W/RFL CULTURE
Bilirubin Urine: NEGATIVE
Glucose, UA: NEGATIVE
Hyaline Cast: NONE SEEN /LPF
Ketones, ur: NEGATIVE
Leukocyte Esterase: NEGATIVE
Nitrites, Initial: NEGATIVE
Specific Gravity, Urine: 1.025 (ref 1.001–1.03)
pH: 5.5 (ref 5.0–8.0)

## 2020-10-21 LAB — CULTURE INDICATED

## 2020-10-22 NOTE — Progress Notes (Signed)
Pt here for post op evaluation of pain. No dysuria, has vaginal bleeding. Contaminated specimen. kd

## 2020-10-26 ENCOUNTER — Other Ambulatory Visit: Payer: Self-pay

## 2020-10-26 ENCOUNTER — Ambulatory Visit (INDEPENDENT_AMBULATORY_CARE_PROVIDER_SITE_OTHER): Payer: PRIVATE HEALTH INSURANCE | Admitting: Obstetrics & Gynecology

## 2020-10-26 ENCOUNTER — Encounter: Payer: Self-pay | Admitting: Obstetrics & Gynecology

## 2020-10-26 VITALS — BP 120/70

## 2020-10-26 DIAGNOSIS — R102 Pelvic and perineal pain: Secondary | ICD-10-CM

## 2020-10-26 DIAGNOSIS — D219 Benign neoplasm of connective and other soft tissue, unspecified: Secondary | ICD-10-CM

## 2020-10-26 DIAGNOSIS — Z09 Encounter for follow-up examination after completed treatment for conditions other than malignant neoplasm: Secondary | ICD-10-CM

## 2020-10-26 NOTE — Progress Notes (Signed)
    Joanna Gross [birth year] [id number]        55 y.o.  G1P0010   RP: Postop Sonata Procedure/HSC/Excision/D+C on 10/06/2020  HPI: Well on Ibuprofen postop until last week when she developed a severe intermittent pain towards the right pelvis/tender uterus.  CT scan 10/19/2020 showed Fibroids with necrosis wnl for Postop Radio-frequency treatment 10/06/2020, no evidence of hemorrhage.  Much improved since then on Naprosyn.  Mild vaginal spotting.  No fever.  Urine/BMs normal.   OB History  Gravida Para Term Preterm AB Living  1 0     1 0  SAB IAB Ectopic Multiple Live Births               # Outcome Date GA Lbr Len/2nd Weight Sex Delivery Anes PTL Lv  1 AB             Past medical history,surgical history, problem list, medications, allergies, family history and social history were all reviewed and documented in the EPIC chart.   Directed ROS with pertinent positives and negatives documented in the history of present illness/assessment and plan.  Exam:  Vitals:   10/26/20 1135  BP: 120/70   General appearance:  Normal  Abdomen: Normal, soft.  No distention.  NT.   Gynecologic exam: Deferred at patient's request.  CT scan 10/19/2020: CT scan 10/19/2020 showed Fibroids with necrosis, no evidence of hemorrhage.   CBC 10/19/20: Hb 12.9 WBC 6.6   Assessment/Plan:  55 y.o. G1P0010   1. Status post gynecological surgery, follow-up exam Good postop evolution at this time.  Patient had severe pain last week which improved since then.  We will slowly wean Naprosyn.  Can resume physical activity progressively.  No complication.  Will reassess fibroid size in 3 months. - US Transvaginal Non-OB; Future  2. Fibroids Will reassess fibroid size in 3 months by pelvic ultrasound. - US Transvaginal Non-OB; Future  3. Pelvic pain Pelvic pain probably associated with necrosis of fibroids post Sonata.  Much improved pain currently.  We will start weaning Naprosyn.  Princess Bruins MD,  11:58 AM 10/26/2020

## 2020-10-29 ENCOUNTER — Telehealth: Payer: Self-pay | Admitting: *Deleted

## 2020-10-29 NOTE — Telephone Encounter (Signed)
Patient called to check status of refill request for Naproxen.   Advised patient refill request has been sent to Dr. Dellis Filbert, please allow 48 hours for refill request.Provioder is seeing patients, response may not be immediate.   Patient states she has 3 tablets left, has been working on reducing to one tablet q8h. Patient thankful for return call.

## 2020-10-29 NOTE — Telephone Encounter (Signed)
Patient called requesting refill on Naproxen 500 mg tablet for pelvic pain, reports she is taking 1 tablet by mouth every 8 hours now per your recommendations on OV 10/26/20 verses every 6 hours.  Please advise

## 2020-10-30 ENCOUNTER — Other Ambulatory Visit: Payer: Self-pay | Admitting: Obstetrics & Gynecology

## 2020-10-30 MED ORDER — NAPROXEN 500 MG PO TABS: 500.0000 mg | ORAL_TABLET | Freq: Three times a day (TID) | ORAL | 0 refills | Status: DC | PRN

## 2020-10-30 MED ORDER — IBUPROFEN 800 MG PO TABS: 800.0000 mg | ORAL_TABLET | Freq: Three times a day (TID) | ORAL | 0 refills | Status: DC | PRN

## 2020-10-30 NOTE — Telephone Encounter (Signed)
Dr.Lavoie it appears you sent in ibuprofen 800 mg tablet Rx, patient is requesting Naproxen 500 mg tablet? Please advise

## 2020-10-30 NOTE — Telephone Encounter (Signed)
Per Dr.Lavoie "If it makes a difference to her, you can send Naproxen 500 mg every 8 hours and decrease to every 12 hours asap. She needs to wean to stop within 2 weeks.  Patient informed with above. Rx sent.

## 2020-11-26 ENCOUNTER — Encounter: Payer: Self-pay | Admitting: Hematology

## 2021-01-12 ENCOUNTER — Other Ambulatory Visit: Payer: Self-pay | Admitting: Family Medicine

## 2021-01-12 DIAGNOSIS — Z1231 Encounter for screening mammogram for malignant neoplasm of breast: Secondary | ICD-10-CM

## 2021-01-14 ENCOUNTER — Other Ambulatory Visit: Payer: PRIVATE HEALTH INSURANCE

## 2021-01-14 ENCOUNTER — Other Ambulatory Visit: Payer: PRIVATE HEALTH INSURANCE | Admitting: Obstetrics & Gynecology

## 2021-02-01 ENCOUNTER — Other Ambulatory Visit: Payer: PRIVATE HEALTH INSURANCE

## 2021-02-01 ENCOUNTER — Ambulatory Visit: Payer: PRIVATE HEALTH INSURANCE | Admitting: Hematology

## 2021-04-05 ENCOUNTER — Inpatient Hospital Stay: Payer: No Typology Code available for payment source | Attending: Family Medicine

## 2021-04-05 ENCOUNTER — Inpatient Hospital Stay: Payer: No Typology Code available for payment source | Admitting: Hematology

## 2021-04-15 ENCOUNTER — Ambulatory Visit: Payer: No Typology Code available for payment source

## 2021-05-18 ENCOUNTER — Ambulatory Visit: Payer: No Typology Code available for payment source

## 2021-06-18 ENCOUNTER — Ambulatory Visit: Payer: No Typology Code available for payment source

## 2021-07-15 ENCOUNTER — Ambulatory Visit: Payer: No Typology Code available for payment source

## 2021-07-26 ENCOUNTER — Encounter: Payer: Self-pay | Admitting: Hematology

## 2021-08-02 ENCOUNTER — Ambulatory Visit: Payer: No Typology Code available for payment source

## 2021-08-18 ENCOUNTER — Encounter: Payer: Self-pay | Admitting: Hematology

## 2021-08-19 ENCOUNTER — Ambulatory Visit: Payer: Self-pay | Admitting: Obstetrics & Gynecology

## 2021-11-02 ENCOUNTER — Ambulatory Visit: Payer: 59 | Admitting: Speech Pathology

## 2021-11-03 ENCOUNTER — Ambulatory Visit: Payer: 59 | Attending: Family Medicine

## 2021-11-03 DIAGNOSIS — R498 Other voice and resonance disorders: Secondary | ICD-10-CM | POA: Insufficient documentation

## 2021-11-03 NOTE — Therapy (Signed)
?OUTPATIENT SPEECH LANGUAGE PATHOLOGY VOICE EVALUATION ? ? ?Patient Name: Joanna Gross ?MRN: [id number] ?DOB:09/22/1965, 56 y.o., female ?Today's Date: 11/03/2021 ? ?PCP: Hayden Rasmussen, MD  ?REFERRING PROVIDER: Hayden Rasmussen, MD  ? ? End of Session - 11/03/21 1637   ? ? Visit Number 1   ? Number of Visits 17   ? Date for SLP Re-Evaluation 12/31/21   ? Authorization Type [employer] $4 copay (27 visits max -12 visits initial)   ? SLP Start Time 1446   ? SLP Stop Time  1530   ? SLP Time Calculation (min) 44 min   ? Activity Tolerance Patient tolerated treatment well   ? ?  ?  ? ?  ? ? ?Past Medical History:  ?Diagnosis Date  ? Fatigue associated with anemia   ? History of seizure 1975  ? per pt had one seizure in 1975, put on medication until 1988, pt stated no seizure since  ? IDA (iron deficiency anemia)   ? followed by dr Irene Limbo (hematology)---  chronic blood loss from uterine fibroids,  treatment iron infusions  ? Menometrorrhagia   ? Pelvic pain   ? Seizures ([place])   ? SOB (shortness of breath) on exertion   ? per pt due to anemia  ? Uterine fibroid   ? ?Past Surgical History:  ?Procedure Laterality Date  ? BREAST SURGERY Bilateral 1987;  1992;  2009  ? excision mass biopsy, pt stated benign  ? BUNIONECTOMY Bilateral 1999  ? CYST EXCISION  1974  ? left side of neck,  per pt benign  ? CYSTECTOMY  1974  ? left side of neck   ? HYSTEROSCOPY WITH D & C N/A 10/06/2020  ? Procedure: DILATATION AND CURETTAGE /HYSTEROSCOPY WITH MYOSURE;  Surgeon: Princess Bruins, MD;  Location: [facility];  Service: Gynecology;  Laterality: N/A;  ? [place] ABDOMINAL APPROACH  2003;  2015  ? ?Patient Active Problem List  ? Diagnosis Date Noted  ? Iron deficiency anemia 03/27/2020  ? Symptomatic anemia 03/26/2018  ? Left breast mass 05/24/2011  ? CHEST PAIN 05/16/2007  ? ? ?Onset date: 10/05/21; 10/14/2021 (referral) ? ?REFERRING DIAG: J38.3 (ICD-10-CM) - Vocal cord dysfunction  ? ?THERAPY DIAG:  ?Other voice and  resonance disorders ? ?SUBJECTIVE:  ? ?SUBJECTIVE STATEMENT: ?"I'm still hoarse" ?Pt accompanied by: self ? ?PERTINENT HISTORY: ?Patient states she developed a recurrence of laryngitis, hoarseness associated with her vocal cord dysfunction after walking in local pharmacy. Unknown instigating agenet this more recent even. Patient has been seen in past by [place] [facility] - Dr. Harold Hedge and ENT Dr. Wilburn Cornelia. Pt diagnosed with allergic rhinitis and GERD and treated accordingly. More recently she has been diagnosed with vocal cord dysfunction.? ? ?PAIN:  ?Are you having pain? No ? ? ?FALLS: Has patient fallen in last 6 months? No ? ?LIVING ENVIRONMENT: ?Lives with: lives with their family (splits time between [place]) ?Lives in: House/apartment ? ?PLOF: Independent ? ?PATIENT GOALS to improve vocal quality  ? ?OBJECTIVE:  ? ?DIAGNOSTIC FINDINGS:  ?ENT 06/2021 ??The patient presents for evaluation with acute onset hoarseness which she associates with eucalyptus exposure. She is actively trying to reduce exposure and has an EpiPen in case of acute emergency. She is scheduled to see Dr. Fredderick Phenix at [facility] in early February. Today's laryngoscopy shows normal-appearing vocal cord ability, no evidence of mass, polyp or acute airway change. Patient airway appears stable. She does have post glottic erythema which may be consistent with  reflux. Recommend treating her for reflux with PPI therapy and reflux precautions. The patient does not wish to restart prednisone because of side effects. Monitor for additional concerns and plan follow-up with [place] as needed, she will use her EpiPen if necessary. Follow-up with her allergist as scheduled. ? ?On physical examination the patient has findings consistent with gastroesophageal reflux. Based on examination and the patient's history, I have recommended aggressive treatment for reflux. This includes avoiding spicy and acidic foods which exacerbate reflux, avoiding late meals  and snacking, avoiding caffeine and stimulants, avoiding carbonated and alcoholic beverages and sleeping with the head of bed elevated. We also discussed in detail OTC therapy as well as the use of proton pump inhibitor therapy. The risks and benefits of prescription medical therapy were discussed. Medications are prescribed as outlined above.?  ? ?COGNITION: ?Overall cognitive status: Within functional limits for tasks assessed ? ?SOCIAL HISTORY: ?Occupation: Has own business  ?Water intake: optimal  ?Caffeine/alcohol intake: minimal (occasionally ~1 cup of coffee/day & occasional alcohol intake) ?Daily voice use: moderate ? ?PERCEPTUAL VOICE ASSESSMENT: ?Voice quality: hoarse, breathy, and strained - inconsistent ?Vocal abuse: abnormal breathing pattern - inconsistent ?Resonance: normal ?Respiratory function: thoracic breathing and clavicular breathing ?Coughing: x5 (no throat clearing observed) ? ? ?PATIENT REPORTED OUTCOME MEASURES (PROM): ?VCD-Q:  36  ? ?TODAY'S TREATMENT:  ?11-03-21: Educated patient on vocal cord dysfunction (VCD), including symptoms, triggers, and treatments.  Handout provided. Intermittent hoarseness and rare aphonia noted in conversation today, with fluctuations noted. Pt exhibited coughing x5 during session. SLP initiated education and instruction of abdominal breathing and sniff-sniff-blow technique to utilize when coughing occurs.  ? ? ?PATIENT EDUCATION: ?Education details: see above ?Person educated: Patient ?Education method: Explanation, Demonstration, and Handouts ?Education comprehension: verbalized understanding, returned demonstration, and needs further education ? ? ?HOME EXERCISE PROGRAM: ?Abdominal breathing for VCD ? ? ? ? ?GOALS: ?Goals reviewed with patient? Yes ? ?SHORT TERM GOALS: Target date: 12/03/2021 ? ?Pt will utilize abdominal breathing 18/20 sentences on structured task with occasional min A over 2 sessions  ?Baseline: hoarseness/clavicular breathing ?Goal status:  INITIAL ? ?2.  Pt will employ abdominal breathing (sniff/blow if tolerated) upon cough/throat clear to reduce duration of VCD attack 3/4x in therapy session with usual min A over 2 sessions  ?Baseline: no strategies in place ?Goal status: INITIAL ? ?3.  Pt will report 25% reduction of VCD sx subjectively outside of therapy  ?Baseline: VCD sx ?Goal status: INITIAL ? ?4.  Pt will employ flow phonation to achieve clear phonation 15/20 sentences with usual min A over 2 sessions ?Baseline: hoarseness/aphonia ?Goal status: INITIAL ? ?5.  Pt will carryover vocal hygiene and reflux precautions to optimize vocal quality given rare min A > 1 week  ?Baseline: suspected acid reflux ?Goal status: INITIAL ? ? ?LONG TERM GOALS: Target date: 12/31/2021 ? ?Pt will employ abdominal breathing during 15 mintute simple conversation with occasional min A over 2 sessions  ?Baseline: hoarseness/clavicular breathing ?Goal status: INITIAL ? ?2.  Pt will employ abdominal breathing (sniff/blow as tolerated) at onset of sensation of VCD attack with rare min A over 2 sessions ?Baseline: no strategies in place ?Goal status: INITIAL ? ?3.  Pt will report 50% reduction of VCD sx subjectively by last ST session  ?Baseline: VCD sx ?Goal status: INITIAL ? ?4.  Pt will achieve clear phonation over 15 minute conversation with occasional min A over 2 sessions  ?Baseline: hoarseness/clavicular breathing ?Goal status: INITIAL ? ? ? ?ASSESSMENT: ? ?CLINICAL IMPRESSION: ?Patient  is a 56 y.o. female who was seen today for vocal cord dysfunction. Onset reported one month ago following allergic reaction to eucalyptus and recent traumatic event of blood transfusion. ENT exam revealed normal-appearing vocal cord functioning. Fluctuating hoarseness exhibited today with rare aphonia. Identified VCD triggers as eucalyptus, strong perfumes, dust, and pollen. Pt c/o hoarseness, intermittent coughing, occasional throat tightness, wheezing, and some burning sensation in  throat. Pt found Prednisone and increased water intake have resolved symptoms intermittently. Today, pt presented with coughing x5 in session. Pt frequently travels and experiences stress related to family medi

## 2021-11-03 NOTE — Patient Instructions (Signed)
?  ABDOMINAL BREATHING FOR VCD ? ?Shoulders down - this is a cue to relax ?Place your hand on your abdomen - this helps you focus on easy abdominal breath support - the best and most relaxed way to breathe ?Breathe in through your nose and fill your belly with air, watching your hand move outward ?Breathe out through your mouth and watch your belly move in. An audible "sh"  may help ? ? ?Think of your belly as a balloon, when you fill with air (inhale), the balloon gets bigger. As the air goes out (exhale), the balloon deflates. ? ?If you are having difficulty coordinating this, lay on your back with a plastic cup on your belly and repeat the above steps, watching you belly move up with inhalation and down with exhalations ? ?Practice breathing in and out in front of a mirror, watching your belly ?Breathe in for a count of 5 and breathe out for a count of 5 ? ?Now as you breathe out, get a picture of relaxing in your mind ?Feel the constant in-out of your breathing with your belly ?Picture the tension in your throat and chest evaporate like steam, melting away and FEEL it do so ?Picture your throat opening up so wide that a grapefruit or softball could fit through your throat. ? ? ?Practice this throughout the day when you are not having symptoms. For example: in the car, when watching TV, before medications. Regular practice when you are feeling well is important. ? ?Make it automatic and use it at the first sense of throat tightness to prevent or suppress the VCD. You may start with the inhale or exhale. ? ?Be patient when completing the breathing. It may take several minutes to start feeling relief ? ?Use the breathing to "pre-treat" yourself before a known trigger for VCD. Possible triggers could be: change in air temperature, strong odors, perfume, and exercise. ? ? ?There's an App for that: Breathe2relax ? ? ?

## 2021-11-11 ENCOUNTER — Ambulatory Visit: Payer: 59

## 2021-11-11 DIAGNOSIS — R498 Other voice and resonance disorders: Secondary | ICD-10-CM | POA: Diagnosis not present

## 2021-11-11 NOTE — Therapy (Signed)
?OUTPATIENT SPEECH LANGUAGE PATHOLOGY TREATMENT NOTE ? ? ?Patient Name: Joanna Gross ?MRN: [id number] ?DOB:Oct 28, 1965, 56 y.o., female ?Today's Date: 11/11/2021 ? ?PCP: Hayden Rasmussen, MD  ?REFERRING PROVIDER: Hayden Rasmussen, MD  ? ?END OF SESSION:  ? End of Session - 11/11/21 1413   ? ? Visit Number 2   ? Number of Visits 17   ? Date for SLP Re-Evaluation 12/31/21   ? Authorization Type [employer]   ? SLP Start Time 2094   pt arrived late  ? SLP Stop Time  1447   ? SLP Time Calculation (min) 35 min   ? Activity Tolerance Patient tolerated treatment well   ? ?  ?  ? ?  ? ? ?Past Medical History:  ?Diagnosis Date  ? Fatigue associated with anemia   ? History of seizure 1975  ? per pt had one seizure in 1975, put on medication until 1988, pt stated no seizure since  ? IDA (iron deficiency anemia)   ? followed by dr Irene Limbo (hematology)---  chronic blood loss from uterine fibroids,  treatment iron infusions  ? Menometrorrhagia   ? Pelvic pain   ? Seizures ([place])   ? SOB (shortness of breath) on exertion   ? per pt due to anemia  ? Uterine fibroid   ? ?Past Surgical History:  ?Procedure Laterality Date  ? BREAST SURGERY Bilateral 1987;  1992;  2009  ? excision mass biopsy, pt stated benign  ? BUNIONECTOMY Bilateral 1999  ? CYST EXCISION  1974  ? left side of neck,  per pt benign  ? CYSTECTOMY  1974  ? left side of neck   ? HYSTEROSCOPY WITH D & C N/A 10/06/2020  ? Procedure: DILATATION AND CURETTAGE /HYSTEROSCOPY WITH MYOSURE;  Surgeon: Princess Bruins, MD;  Location: [facility];  Service: Gynecology;  Laterality: N/A;  ? [place] ABDOMINAL APPROACH  2003;  2015  ? ?Patient Active Problem List  ? Diagnosis Date Noted  ? Iron deficiency anemia 03/27/2020  ? Symptomatic anemia 03/26/2018  ? Left breast mass 05/24/2011  ? CHEST PAIN 05/16/2007  ? ? ?ONSET DATE:  10/05/21; 10/14/2021 (referral) ? ?REFERRING DIAG: J38.3 (ICD-10-CM) - Vocal cord dysfunction  ? ?THERAPY DIAG:  ?Other voice and  resonance disorders ? ?SUBJECTIVE: "getting better" re: vocal quality  ? ?PAIN:  ?Are you having pain? No ? ? ?OBJECTIVE:  ? ?TODAY'S TREATMENT:  ?11-11-21: Improved vocal quality exhibited today with rare hoarseness and strained vocal quality noted in conversation. Some sensation of throat constricting reported at night time or if hand placed near throat. SLP educated and instructed abdominal breathing as well as relaxation techniques to reduce tension and optimize breathing pattern. Pt able to successfully reduce constricting sensation with SLP modeling and cues. SLP targeted abdominal breathing at sentence level, in which SLP provided occasional fading to rare A to optimize breath cycle and timing. Pt exhibited cough x2 and throat clear x1 this session.  ? ?11-03-21: Educated patient on vocal cord dysfunction (VCD), including symptoms, triggers, and treatments.  Handout provided. Intermittent hoarseness and rare aphonia noted in conversation today, with fluctuations noted. Pt exhibited coughing x5 during session. SLP initiated education and instruction of abdominal breathing and sniff-sniff-blow technique to utilize when coughing occurs.  ?  ?  ?PATIENT EDUCATION: ?Education details: see above ?Person educated: Patient ?Education method: Explanation, Demonstration, and Handouts ?Education comprehension: verbalized understanding, returned demonstration, and needs further education ?  ?  ?HOME EXERCISE PROGRAM: ?Abdominal breathing and relaxation  techniques for VCD ?  ? ?  ?GOALS: ?Goals reviewed with patient? Yes ?  ?SHORT TERM GOALS: Target date: 12/03/2021 ?  ?Pt will utilize abdominal breathing 18/20 sentences on structured task with occasional min A over 2 sessions  ?Baseline: hoarseness/clavicular breathing ?Goal status: ongoing ?  ?2.  Pt will employ abdominal breathing (sniff/blow if tolerated) upon cough/throat clear to reduce duration of VCD attack 3/4x in therapy session with usual min A over 2 sessions   ?Baseline: no strategies in place ?Goal status: ongoing ?  ?3.  Pt will report 25% reduction of VCD sx subjectively outside of therapy  ?Baseline: VCD sx ?Goal status: ongoing ?  ?4.  Pt will employ flow phonation to achieve clear phonation 15/20 sentences with usual min A over 2 sessions ?Baseline: hoarseness/aphonia ?Goal status: ongoing ?  ?5.  Pt will carryover vocal hygiene and reflux precautions to optimize vocal quality given rare min A > 1 week  ?Baseline: suspected acid reflux ?Goal status: ongoing ?  ?  ?LONG TERM GOALS: Target date: 12/31/2021 ?  ?Pt will employ abdominal breathing during 15 mintute simple conversation with occasional min A over 2 sessions  ?Baseline: hoarseness/clavicular breathing ?Goal status: ongoing ?  ?2.  Pt will employ abdominal breathing (sniff/blow as tolerated) at onset of sensation of VCD attack with rare min A over 2 sessions ?Baseline: no strategies in place ?Goal status: ongoing ?  ?3.  Pt will report 50% reduction of VCD sx subjectively by last ST session  ?Baseline: VCD sx ?Goal status: ongoing ?  ?4.  Pt will achieve clear phonation over 15 minute conversation with occasional min A over 2 sessions  ?Baseline: hoarseness/clavicular breathing ?Goal status: ongoing ?  ?  ?  ?ASSESSMENT: ?  ?CLINICAL IMPRESSION: ?Patient is a 56 y.o. female who was seen today for vocal cord dysfunction. Conducted ongoing education and training of VCD techniques to reduce coughing and hoarseness, with good carryover exhibited thus far. Pt would benefit from skilled ST intervention to address vocal cord dysfunction to optimize vocal quality, decrease coughing, and increase communication effectiveness.  ?  ?OBJECTIVE IMPAIRMENTS include voice disorder. These impairments are limiting patient from effectively communicating at home and in community. No overt factors noted to affect potential to achieve goals and functional outcome. Patient will benefit from skilled SLP services to address above  impairments and improve overall function. ?  ?REHAB POTENTIAL: Good ?  ?PLAN: ?SLP FREQUENCY: 2x/week ?  ?SLP DURATION: 8 weeks ?  ?PLANNED INTERVENTIONS: Functional tasks, SLP instruction and feedback, Compensatory strategies, and Patient/family education ?  ? ? ?Marzetta Board, CCC-SLP ?11/11/2021, 2:54 PM ? ? ?

## 2021-11-12 ENCOUNTER — Ambulatory Visit: Payer: 59

## 2021-11-23 ENCOUNTER — Ambulatory Visit: Payer: 59

## 2021-11-24 ENCOUNTER — Ambulatory Visit: Payer: 59

## 2021-12-14 ENCOUNTER — Ambulatory Visit: Payer: 59

## 2021-12-15 ENCOUNTER — Ambulatory Visit: Payer: 59

## 2022-03-17 ENCOUNTER — Encounter (HOSPITAL_COMMUNITY): Payer: Self-pay

## 2022-03-17 ENCOUNTER — Inpatient Hospital Stay (HOSPITAL_COMMUNITY)
Admission: EM | Admit: 2022-03-17 | Discharge: 2022-03-20 | DRG: 392 | Disposition: A | Discharge status: Home / Self Care | Payer: No Typology Code available for payment source | Attending: Internal Medicine | Admitting: Internal Medicine

## 2022-03-17 ENCOUNTER — Other Ambulatory Visit: Payer: Self-pay

## 2022-03-17 DIAGNOSIS — R1030 Lower abdominal pain, unspecified: Secondary | ICD-10-CM

## 2022-03-17 DIAGNOSIS — Z79899 Other long term (current) drug therapy: Secondary | ICD-10-CM

## 2022-03-17 DIAGNOSIS — K529 Noninfective gastroenteritis and colitis, unspecified: Principal | ICD-10-CM | POA: Diagnosis present

## 2022-03-17 DIAGNOSIS — K567 Ileus, unspecified: Secondary | ICD-10-CM | POA: Diagnosis present

## 2022-03-17 DIAGNOSIS — K648 Other hemorrhoids: Secondary | ICD-10-CM | POA: Diagnosis present

## 2022-03-17 DIAGNOSIS — E669 Obesity, unspecified: Secondary | ICD-10-CM | POA: Diagnosis present

## 2022-03-17 DIAGNOSIS — Z6838 Body mass index (BMI) 38.0-38.9, adult: Secondary | ICD-10-CM

## 2022-03-17 DIAGNOSIS — E119 Type 2 diabetes mellitus without complications: Secondary | ICD-10-CM | POA: Diagnosis present

## 2022-03-17 DIAGNOSIS — Z7985 Long-term (current) use of injectable non-insulin antidiabetic drugs: Secondary | ICD-10-CM

## 2022-03-17 DIAGNOSIS — K921 Melena: Secondary | ICD-10-CM | POA: Diagnosis present

## 2022-03-17 DIAGNOSIS — Z9102 Food additives allergy status: Secondary | ICD-10-CM

## 2022-03-17 DIAGNOSIS — Z20822 Contact with and (suspected) exposure to covid-19: Secondary | ICD-10-CM | POA: Diagnosis present

## 2022-03-17 DIAGNOSIS — R112 Nausea with vomiting, unspecified: Secondary | ICD-10-CM | POA: Diagnosis present

## 2022-03-17 DIAGNOSIS — Z880 Allergy status to penicillin: Secondary | ICD-10-CM

## 2022-03-17 DIAGNOSIS — J209 Acute bronchitis, unspecified: Secondary | ICD-10-CM | POA: Diagnosis present

## 2022-03-17 DIAGNOSIS — Z888 Allergy status to other drugs, medicaments and biological substances status: Secondary | ICD-10-CM

## 2022-03-17 DIAGNOSIS — J40 Bronchitis, not specified as acute or chronic: Secondary | ICD-10-CM | POA: Diagnosis present

## 2022-03-17 DIAGNOSIS — Z881 Allergy status to other antibiotic agents status: Secondary | ICD-10-CM

## 2022-03-17 DIAGNOSIS — Z91030 Bee allergy status: Secondary | ICD-10-CM

## 2022-03-17 LAB — CBC
HCT: 46.8 % — ABNORMAL HIGH (ref 36.0–46.0)
Hemoglobin: 14.9 g/dL (ref 12.0–15.0)
MCH: 30.8 pg (ref 26.0–34.0)
MCHC: 31.8 g/dL (ref 30.0–36.0)
MCV: 96.9 fL (ref 80.0–100.0)
Platelets: 158 10*3/uL (ref 150–400)
RBC: 4.83 MIL/uL (ref 3.87–5.11)
RDW: 13.5 % (ref 11.5–15.5)
WBC: 10 10*3/uL (ref 4.0–10.5)
nRBC: 0 % (ref 0.0–0.2)

## 2022-03-17 MED ORDER — ONDANSETRON 4 MG PO TBDP: 4.0000 mg | ORAL_TABLET | Freq: Once | ORAL | Status: DC

## 2022-03-17 NOTE — ED Triage Notes (Signed)
BIB [facility] with c/o rectal bleeding after attempting to have a bowel movement. States she's constipated and as been having bright red blood when she wipes.   Recently in [place] at the end of August, started having URI sx - dx with PNA, ear infection, started on Doxy.

## 2022-03-18 ENCOUNTER — Encounter: Payer: Self-pay | Admitting: Hematology

## 2022-03-18 ENCOUNTER — Emergency Department (HOSPITAL_COMMUNITY): Payer: No Typology Code available for payment source

## 2022-03-18 ENCOUNTER — Encounter (HOSPITAL_COMMUNITY): Payer: Self-pay | Admitting: Internal Medicine

## 2022-03-18 DIAGNOSIS — K567 Ileus, unspecified: Secondary | ICD-10-CM | POA: Diagnosis present

## 2022-03-18 DIAGNOSIS — Z6838 Body mass index (BMI) 38.0-38.9, adult: Secondary | ICD-10-CM | POA: Diagnosis not present

## 2022-03-18 DIAGNOSIS — Z888 Allergy status to other drugs, medicaments and biological substances status: Secondary | ICD-10-CM | POA: Diagnosis not present

## 2022-03-18 DIAGNOSIS — E669 Obesity, unspecified: Secondary | ICD-10-CM | POA: Diagnosis present

## 2022-03-18 DIAGNOSIS — K529 Noninfective gastroenteritis and colitis, unspecified: Secondary | ICD-10-CM | POA: Diagnosis present

## 2022-03-18 DIAGNOSIS — Z91030 Bee allergy status: Secondary | ICD-10-CM | POA: Diagnosis not present

## 2022-03-18 DIAGNOSIS — J40 Bronchitis, not specified as acute or chronic: Secondary | ICD-10-CM | POA: Diagnosis present

## 2022-03-18 DIAGNOSIS — Z7985 Long-term (current) use of injectable non-insulin antidiabetic drugs: Secondary | ICD-10-CM | POA: Diagnosis not present

## 2022-03-18 DIAGNOSIS — J209 Acute bronchitis, unspecified: Secondary | ICD-10-CM | POA: Diagnosis present

## 2022-03-18 DIAGNOSIS — Z20822 Contact with and (suspected) exposure to covid-19: Secondary | ICD-10-CM | POA: Diagnosis present

## 2022-03-18 DIAGNOSIS — Z9102 Food additives allergy status: Secondary | ICD-10-CM | POA: Diagnosis not present

## 2022-03-18 DIAGNOSIS — R1030 Lower abdominal pain, unspecified: Secondary | ICD-10-CM

## 2022-03-18 DIAGNOSIS — K921 Melena: Secondary | ICD-10-CM | POA: Diagnosis not present

## 2022-03-18 DIAGNOSIS — R112 Nausea with vomiting, unspecified: Secondary | ICD-10-CM | POA: Diagnosis not present

## 2022-03-18 DIAGNOSIS — Z881 Allergy status to other antibiotic agents status: Secondary | ICD-10-CM | POA: Diagnosis not present

## 2022-03-18 DIAGNOSIS — K648 Other hemorrhoids: Secondary | ICD-10-CM | POA: Diagnosis present

## 2022-03-18 DIAGNOSIS — Z79899 Other long term (current) drug therapy: Secondary | ICD-10-CM | POA: Diagnosis not present

## 2022-03-18 DIAGNOSIS — Z880 Allergy status to penicillin: Secondary | ICD-10-CM | POA: Diagnosis not present

## 2022-03-18 DIAGNOSIS — E119 Type 2 diabetes mellitus without complications: Secondary | ICD-10-CM | POA: Diagnosis present

## 2022-03-18 LAB — COMPREHENSIVE METABOLIC PANEL
ALT: 19 U/L (ref 0–44)
AST: 19 U/L (ref 15–41)
Albumin: 4.4 g/dL (ref 3.5–5.0)
Alkaline Phosphatase: 72 U/L (ref 38–126)
Anion gap: 9 (ref 5–15)
BUN: 7 mg/dL (ref 6–20)
CO2: 24 mmol/L (ref 22–32)
Calcium: 10.3 mg/dL (ref 8.9–10.3)
Chloride: 103 mmol/L (ref 98–111)
Creatinine, Ser: 0.71 mg/dL (ref 0.44–1.00)
GFR, Estimated: >60 mL/min (ref >60)
Glucose, Bld: 99 mg/dL (ref 70–99)
Potassium: 4.5 mmol/L (ref 3.5–5.1)
Sodium: 136 mmol/L (ref 135–145)
Total Bilirubin: 0.7 mg/dL (ref 0.3–1.2)
Total Protein: 8 g/dL (ref 6.5–8.1)

## 2022-03-18 LAB — SARS CORONAVIRUS 2 BY RT PCR: SARS Coronavirus 2 by RT PCR: NEGATIVE

## 2022-03-18 LAB — POC OCCULT BLOOD, ED: Fecal Occult Bld: POSITIVE — AB

## 2022-03-18 LAB — D-DIMER, QUANTITATIVE: D-Dimer, Quant: 0.77 ug/mL-FEU — ABNORMAL HIGH (ref 0.00–0.50)

## 2022-03-18 LAB — TYPE AND SCREEN
ABO/RH(D): A POS
Antibody Screen: NEGATIVE

## 2022-03-18 MED ORDER — ONDANSETRON HCL 4 MG/2ML IJ SOLN
4.0000 mg | Freq: Four times a day (QID) | INTRAMUSCULAR | Status: DC | PRN
  Administered 2022-03-18: 4 mg via INTRAVENOUS
  Filled 2022-03-18: qty 2

## 2022-03-18 MED ORDER — IOHEXOL 350 MG/ML SOLN
100.0000 mL | Freq: Once | INTRAVENOUS | Status: AC | PRN
  Administered 2022-03-18: 100 mL via INTRAVENOUS

## 2022-03-18 MED ORDER — ACETAMINOPHEN 650 MG RE SUPP: 650.0000 mg | Freq: Four times a day (QID) | RECTAL | Status: DC | PRN

## 2022-03-18 MED ORDER — LACTATED RINGERS IV SOLN: INTRAVENOUS | Status: DC

## 2022-03-18 MED ORDER — SODIUM CHLORIDE (PF) 0.9 % IJ SOLN
INTRAMUSCULAR | Status: AC
  Filled 2022-03-18: qty 50

## 2022-03-18 MED ORDER — ACETAMINOPHEN 325 MG PO TABS
650.0000 mg | ORAL_TABLET | Freq: Four times a day (QID) | ORAL | Status: DC | PRN
  Administered 2022-03-19 (×2): 650 mg via ORAL
  Filled 2022-03-18 (×2): qty 2

## 2022-03-18 MED ORDER — BISACODYL 10 MG RE SUPP
10.0000 mg | Freq: Once | RECTAL | Status: AC
  Administered 2022-03-18: 10 mg via RECTAL
  Filled 2022-03-18: qty 1

## 2022-03-18 MED ORDER — DIPHENHYDRAMINE HCL 50 MG/ML IJ SOLN
12.5000 mg | Freq: Once | INTRAMUSCULAR | Status: AC
  Administered 2022-03-18: 12.5 mg via INTRAVENOUS
  Filled 2022-03-18: qty 1

## 2022-03-18 MED ORDER — ALBUTEROL SULFATE (2.5 MG/3ML) 0.083% IN NEBU
2.5000 mg | INHALATION_SOLUTION | RESPIRATORY_TRACT | Status: DC | PRN
  Administered 2022-03-18 – 2022-03-19 (×2): 2.5 mg via RESPIRATORY_TRACT
  Filled 2022-03-18 (×2): qty 3

## 2022-03-18 MED ORDER — POLYETHYLENE GLYCOL 3350 17 G PO PACK
17.0000 g | PACK | Freq: Two times a day (BID) | ORAL | Status: DC
  Administered 2022-03-18 – 2022-03-20 (×4): 17 g via ORAL
  Filled 2022-03-18 (×4): qty 1

## 2022-03-18 MED ORDER — ONDANSETRON HCL 4 MG/2ML IJ SOLN
4.0000 mg | Freq: Once | INTRAMUSCULAR | Status: AC
  Administered 2022-03-18: 4 mg via INTRAVENOUS
  Filled 2022-03-18: qty 2

## 2022-03-18 MED ORDER — METOCLOPRAMIDE HCL 5 MG/ML IJ SOLN
10.0000 mg | INTRAMUSCULAR | Status: AC
  Administered 2022-03-18: 10 mg via INTRAVENOUS
  Filled 2022-03-18: qty 2

## 2022-03-18 MED ORDER — LACTATED RINGERS IV BOLUS
1000.0000 mL | Freq: Once | INTRAVENOUS | Status: AC
  Administered 2022-03-18: 1000 mL via INTRAVENOUS

## 2022-03-18 MED ORDER — BENZONATATE 100 MG PO CAPS: 100.0000 mg | ORAL_CAPSULE | Freq: Two times a day (BID) | ORAL | Status: DC | PRN

## 2022-03-18 MED ORDER — HYDROCORTISONE ACETATE 25 MG RE SUPP
25.0000 mg | Freq: Two times a day (BID) | RECTAL | Status: DC
  Administered 2022-03-18 – 2022-03-20 (×4): 25 mg via RECTAL
  Filled 2022-03-18 (×5): qty 1

## 2022-03-18 MED ORDER — MOMETASONE FURO-FORMOTEROL FUM 200-5 MCG/ACT IN AERO
2.0000 | INHALATION_SPRAY | Freq: Two times a day (BID) | RESPIRATORY_TRACT | Status: DC
  Administered 2022-03-19 – 2022-03-20 (×3): 2 via RESPIRATORY_TRACT
  Filled 2022-03-18 (×2): qty 8.8

## 2022-03-18 MED ORDER — ONDANSETRON HCL 4 MG PO TABS: 4.0000 mg | ORAL_TABLET | Freq: Four times a day (QID) | ORAL | Status: DC | PRN

## 2022-03-18 NOTE — Assessment & Plan Note (Addendum)
Cough and URI symptoms now slowly starting to improve following course of doxycycline and nebs. CTA chest today neg for PE or other acute finding, PNA etc. COVID neg 1. Dulera 2. Albuterol PRN 3. Cont tessalon for cough suppression 4. Since improving already, will hold off on much more in the way of work up for the moment.

## 2022-03-18 NOTE — Assessment & Plan Note (Signed)
Constipated BM followed by [facility] earlier today. Suspect internal hemorrhoid most likely from the constipation. 1. Monitor for further bleeding episodes 2. Repeat CBC in AM 3. SCDs only for DVT ppx 4. Probably needs GI follow up at a minimum though.

## 2022-03-18 NOTE — ED Notes (Signed)
PT to restroom. BM x 2

## 2022-03-18 NOTE — Assessment & Plan Note (Addendum)
Decreased motility, N/V, and constipation.  CT scan question of ileus.  Initially was wondering if [name] might be contributing but she had been on stable Wegovy dose without issue for a couple of months prior to onset of symptoms and symptoms continue to worsen despite being off of [facility] for past 2 weeks. Possibly due to recent URI illness?  GI symptoms continue to worsen despite improvement in respiratory symptoms though. 1. Continue to hold [facility] 2. NPO 3. IVF 4. Given [facility] + GI findings will put in for GI consult for AM. 1. Though [facility]BPR may just be internal hemorrhoid from the sounds of it, not really sure why ileus + constipation symptoms continue to worsen. 2. If nothing else though, this is a 56 yo who has never had a screening colonoscopy, and therefore is presumably overdue for this as outpt.

## 2022-03-18 NOTE — Progress Notes (Signed)
No charge note  Patient seen and examined this morning, discussed with Dr. Alcario Drought, H&P reviewed and agree with the assessment and plan  This is a 56 year old female with history of obesity, who has been having some nonspecific GI and respiratory symptoms following a trip in [place].  She returned on August 23, and on the 25th she started experiencing intermittent abdominal discomfort, nausea, and her GI symptoms have progressed now to the point that she has difficulties with p.o. intake, persistent nausea and started having emesis with 1 episode yesterday.  She is also been having intermittent shortness of breath and nonspecific URI symptoms, for which she completed a course of doxycycline as an outpatient, and now her respiratory symptoms have improved.  She is also has noticed bright red blood per rectum, when she wipes but also mixed with stool and also bleeding by itself.  Due to persistent GI issues she came to the hospital.  CT scan on admission with potentially enteritis in the left upper quadrant jejunal loops which are mildly distended without associated wall thickening or substantial edema, potentially representing underlying ileus versus enteritis.  Of note, she was started on Wegovy earlier this year for weight loss, and prior to [place] trip she was tolerating that well.  [name] could potentially cause ileus and I am not sure whether it can have this side effect later on in the treatment but certainly possible.  Gastroenterology has been consulted, appreciate input.  Gretna Bergin M. Cruzita Lederer, MD, PhD [facility]  Between 7 am - 7 pm you can contact me via Amion (for emergencies) or [place] (non urgent matters).  I am not available 7 pm - 7 am, please contact night coverage MD/APP via Amion

## 2022-03-18 NOTE — ED Provider Notes (Signed)
[place] DEPT Provider Note   CSN: [id number] Arrival date & time: 03/17/22  2158     History  Chief Complaint  Patient presents with   Rectal Bleeding    Joanna Gross is a 56 y.o. female.  56 year old female with a history of anemia presents to the emergency department with multiple complaints.  She states that she has not felt well since returning home from [place] on 02/23/2022.  Reports that her first symptom was some discomfort in her suprapubic abdomen.  She saw her PCP through a telehealth visit and was started on ciprofloxacin for presumed UTI, though she never experienced any dysuria or urgency.  Subsequently developed upper respiratory symptoms including cough, shortness of breath, dyspnea on exertion.  Has been coughing up phlegm streaked with bright red blood.  Also noted some dizziness characterized as being off balance.  Was told that she had pneumonia as well as middle ear effusions accounting for her dizziness.  Given a 10-day course of doxycycline at urgent care beginning on 03/04/2022.  Patient went for follow-up with her primary care doctor 4 days ago in light of her persistent symptoms and was given a steroid inhaler to use.  She continues to feel short of breath with any degree of exertion.  She has not had any syncope or chest pain.  Patient also reporting ongoing abdominal pain which has never fully resolved.  She has become increasingly constipated with her last bowel movement 2 days ago.  She was straining to use the toilet tonight and had some bright red blood in the toilet bowl.  This is what concerned her this evening to bring her to the ED for evaluation.  She has had prior surgery for uterine fibroids, but no other abdominal surgeries.  Has had low-grade fevers up to 100 F, intermittent sweats.  Denies known sick contacts.  The history is provided by the patient. No language interpreter was used.  Rectal Bleeding      Home  Medications Prior to Admission medications   Medication Sig Start Date End Date Taking? Authorizing Provider  benzonatate (TESSALON) 100 MG capsule Take 100 mg by mouth 2 (two) times daily as needed for cough. 03/08/22  Yes [provider]  cholecalciferol (VITAMIN D3) 25 MCG (1000 UNIT) tablet Take 1,000 Units by mouth daily.   Yes [provider]  EPINEPHrine 0.3 mg/0.3 mL IJ SOAJ injection Inject 0.3 mg into the muscle as needed for anaphylaxis. 02/10/22  Yes [provider]  fluticasone (FLONASE) 50 MCG/ACT nasal spray Place 2 sprays into both nostrils daily. 11/13/21  Yes [provider]  ondansetron (ZOFRAN) 8 MG tablet Take 8 mg by mouth 3 (three) times daily as needed for nausea or vomiting. 03/14/22  Yes [provider]  WEGOVY 1.7 MG/0.75ML SOAJ Inject 1.7 mg into the skin once a week. 02/13/22  Yes [provider]  Grant Ruts INHUB 250-50 MCG/ACT AEPB Inhale 1 puff into the lungs 2 (two) times daily. 03/14/22  Yes [provider]      Allergies    Amoxicillin, Erythromycin, Furosemide, Penicillins, Bee venom, and Eucalyptus flavor [flavoring agent]    Review of Systems   Review of Systems  Gastrointestinal:  Positive for hematochezia.  Ten systems reviewed and are negative for acute change, except as noted in the HPI.    Physical Exam Updated Vital Signs BP 111/70   Pulse 95   Temp 97.9 F (36.6 C) (Oral)   Resp 16  Ht '5\' 6"'$  (1.676 m)   Wt 108.9 kg   SpO2 99%   BMI 38.75 kg/m   Physical Exam Vitals and nursing note reviewed.  Constitutional:      General: She is not in acute distress.    Appearance: She is well-developed. She is not diaphoretic.     Comments: Nontoxic.  HENT:     Head: Normocephalic and atraumatic.  Eyes:     General: No scleral icterus.    Conjunctiva/sclera: Conjunctivae normal.  Cardiovascular:     Rate and Rhythm: Normal rate and regular rhythm.     Pulses: Normal pulses.  Pulmonary:      Effort: Pulmonary effort is normal. No respiratory distress.     Comments: Respirations even and unlabored Abdominal:     Palpations: Abdomen is soft.     Tenderness: There is abdominal tenderness. There is no guarding.     Comments: Abdomen is soft, distended, obese. Generalized TTP without peritoneal signs.  Genitourinary:    Comments: DRE performed by RN who reports some dark red blood with Lenk stool. No melena. Musculoskeletal:        General: Normal range of motion.     Cervical back: Normal range of motion.  Skin:    General: Skin is warm and dry.     Coloration: Skin is not pale.     Findings: No erythema or rash.  Neurological:     Mental Status: She is alert and oriented to person, place, and time.  Psychiatric:        Behavior: Behavior normal.     ED Results / Procedures / Treatments   Labs (all labs ordered are listed, but only abnormal results are displayed) Labs Reviewed  CBC - Abnormal; Notable for the following components:      Result Value   HCT 46.8 (*)    All other components within normal limits  D-DIMER, QUANTITATIVE - Abnormal; Notable for the following components:   D-Dimer, Quant 0.77 (*)    All other components within normal limits  POC OCCULT BLOOD, ED - Abnormal; Notable for the following components:   Fecal Occult Bld POSITIVE (*)    All other components within normal limits  SARS CORONAVIRUS 2 BY RT PCR  RESPIRATORY PANEL BY PCR  COMPREHENSIVE METABOLIC PANEL  TYPE AND SCREEN    EKG None  Radiology CT Angio Chest PE W and/or Wo Contrast  Result Date: 03/18/2022 CLINICAL DATA:  Recent travel with upper respiratory symptoms. Rectal bleeding. EXAM: CT ANGIOGRAPHY CHEST CT ABDOMEN AND PELVIS WITH CONTRAST TECHNIQUE: Multidetector CT imaging of the chest was performed using the standard protocol during bolus administration of intravenous contrast. Multiplanar CT image reconstructions and MIPs were obtained to evaluate the vascular  anatomy. Multidetector CT imaging of the abdomen and pelvis was performed using the standard protocol during bolus administration of intravenous contrast. RADIATION DOSE REDUCTION: This exam was performed according to the departmental dose-optimization program which includes automated exposure control, adjustment of the mA and/or kV according to patient size and/or use of iterative reconstruction technique. CONTRAST:  12m OMNIPAQUE IOHEXOL 350 MG/ML SOLN COMPARISON:  Abdomen pelvis CT 10/19/2020 FINDINGS: CTA CHEST FINDINGS Cardiovascular: The heart size is upper normal. No substantial pericardial effusion. No thoracic aortic aneurysm. There is no filling defect within the opacified pulmonary arteries to suggest the presence of an acute pulmonary embolus. Mediastinum/Nodes: No mediastinal lymphadenopathy. There is no hilar lymphadenopathy. The esophagus has normal imaging features. There is no axillary lymphadenopathy. Lungs/Pleura: No  suspicious pulmonary nodule or mass. No focal airspace consolidation. No pulmonary edema pleural effusion. Skip No worrisome lytic or sclerotic osseous abnormality. Musculoskeletal: Review of the MIP images confirms the above findings. CT ABDOMEN and PELVIS FINDINGS Hepatobiliary: No suspicious focal abnormality within the liver parenchyma. Small area of low attenuation in the anterior liver, adjacent to the falciform ligament, is in a characteristic location for focal fatty deposition. 3 mm gallstone identified towards the neck of the gallbladder. No intrahepatic or extrahepatic biliary dilation. Pancreas: No focal mass lesion. No dilatation of the main duct. No intraparenchymal cyst. No peripancreatic edema. Spleen: No splenomegaly. No focal mass lesion. Adrenals/Urinary Tract: No adrenal nodule or mass. Kidneys unremarkable. No evidence for hydroureter. The urinary bladder appears normal for the degree of distention. Stomach/Bowel: Stomach is mildly distended with fluid. Duodenum  is normally positioned as is the ligament of Treitz. Jejunal loops in the left upper quadrant are fluid-filled and mildly distended up to about 2.8 cm maximum diameter. No associated wall thickening or substantial perienteric edema/inflammation. No abrupt or discrete transition zone is evident although mid and distal small bowel is completely decompressed. The terminal ileum is normal. The appendix is not well visualized, but there is no edema or inflammation in the region of the cecum. No gross colonic mass. No colonic wall thickening. No substantial stool volume to suggest clinical constipation. Vascular/Lymphatic: No abdominal aortic aneurysm. There is no gastrohepatic or hepatoduodenal ligament lymphadenopathy. No retroperitoneal or mesenteric lymphadenopathy. No pelvic sidewall lymphadenopathy. Reproductive: Uterus is enlarged secondary to numerous fibroids. There is no adnexal mass. Other: No intraperitoneal free fluid. Musculoskeletal: No worrisome lytic or sclerotic osseous abnormality. Review of the MIP images confirms the above findings. IMPRESSION: 1. No CT evidence for acute pulmonary embolus. 2. No acute findings in the chest. 3. Jejunal loops in the left upper quadrant are fluid-filled and mildly distended up to about 2.8 cm maximum diameter. No associated wall thickening or substantial perienteric edema/inflammation. No abrupt or discrete transition zone is evident although mid and distal small bowel is completely decompressed. Imaging features considered nonspecific but may reflect a component of underlying ileus or enteritis. 4. Cholelithiasis without evidence for cholecystitis. 5. No substantial stool volume to suggest clinical constipation. No colonic mass lesion by CT imaging. 6. Fibroid uterus. Electronically Signed   By: Misty Stanley M.D.   On: 03/18/2022 05:15   CT ABDOMEN PELVIS W CONTRAST  Result Date: 03/18/2022 CLINICAL DATA:  Recent travel with upper respiratory symptoms. Rectal  bleeding. EXAM: CT ANGIOGRAPHY CHEST CT ABDOMEN AND PELVIS WITH CONTRAST TECHNIQUE: Multidetector CT imaging of the chest was performed using the standard protocol during bolus administration of intravenous contrast. Multiplanar CT image reconstructions and MIPs were obtained to evaluate the vascular anatomy. Multidetector CT imaging of the abdomen and pelvis was performed using the standard protocol during bolus administration of intravenous contrast. RADIATION DOSE REDUCTION: This exam was performed according to the departmental dose-optimization program which includes automated exposure control, adjustment of the mA and/or kV according to patient size and/or use of iterative reconstruction technique. CONTRAST:  118m OMNIPAQUE IOHEXOL 350 MG/ML SOLN COMPARISON:  Abdomen pelvis CT 10/19/2020 FINDINGS: CTA CHEST FINDINGS Cardiovascular: The heart size is upper normal. No substantial pericardial effusion. No thoracic aortic aneurysm. There is no filling defect within the opacified pulmonary arteries to suggest the presence of an acute pulmonary embolus. Mediastinum/Nodes: No mediastinal lymphadenopathy. There is no hilar lymphadenopathy. The esophagus has normal imaging features. There is no axillary lymphadenopathy. Lungs/Pleura: No suspicious pulmonary  nodule or mass. No focal airspace consolidation. No pulmonary edema pleural effusion. Skip No worrisome lytic or sclerotic osseous abnormality. Musculoskeletal: Review of the MIP images confirms the above findings. CT ABDOMEN and PELVIS FINDINGS Hepatobiliary: No suspicious focal abnormality within the liver parenchyma. Small area of low attenuation in the anterior liver, adjacent to the falciform ligament, is in a characteristic location for focal fatty deposition. 3 mm gallstone identified towards the neck of the gallbladder. No intrahepatic or extrahepatic biliary dilation. Pancreas: No focal mass lesion. No dilatation of the main duct. No intraparenchymal cyst.  No peripancreatic edema. Spleen: No splenomegaly. No focal mass lesion. Adrenals/Urinary Tract: No adrenal nodule or mass. Kidneys unremarkable. No evidence for hydroureter. The urinary bladder appears normal for the degree of distention. Stomach/Bowel: Stomach is mildly distended with fluid. Duodenum is normally positioned as is the ligament of Treitz. Jejunal loops in the left upper quadrant are fluid-filled and mildly distended up to about 2.8 cm maximum diameter. No associated wall thickening or substantial perienteric edema/inflammation. No abrupt or discrete transition zone is evident although mid and distal small bowel is completely decompressed. The terminal ileum is normal. The appendix is not well visualized, but there is no edema or inflammation in the region of the cecum. No gross colonic mass. No colonic wall thickening. No substantial stool volume to suggest clinical constipation. Vascular/Lymphatic: No abdominal aortic aneurysm. There is no gastrohepatic or hepatoduodenal ligament lymphadenopathy. No retroperitoneal or mesenteric lymphadenopathy. No pelvic sidewall lymphadenopathy. Reproductive: Uterus is enlarged secondary to numerous fibroids. There is no adnexal mass. Other: No intraperitoneal free fluid. Musculoskeletal: No worrisome lytic or sclerotic osseous abnormality. Review of the MIP images confirms the above findings. IMPRESSION: 1. No CT evidence for acute pulmonary embolus. 2. No acute findings in the chest. 3. Jejunal loops in the left upper quadrant are fluid-filled and mildly distended up to about 2.8 cm maximum diameter. No associated wall thickening or substantial perienteric edema/inflammation. No abrupt or discrete transition zone is evident although mid and distal small bowel is completely decompressed. Imaging features considered nonspecific but may reflect a component of underlying ileus or enteritis. 4. Cholelithiasis without evidence for cholecystitis. 5. No substantial stool  volume to suggest clinical constipation. No colonic mass lesion by CT imaging. 6. Fibroid uterus. Electronically Signed   By: Misty Stanley M.D.   On: 03/18/2022 05:15   DG Chest Port 1 View  Result Date: 03/18/2022 CLINICAL DATA:  Cough and shortness of breath. EXAM: PORTABLE CHEST 1 VIEW COMPARISON:  August 06, 2013 FINDINGS: The heart size and mediastinal contours are within normal limits. Both lungs are clear. The visualized skeletal structures are unremarkable. IMPRESSION: No active disease. Electronically Signed   By: Virgina Norfolk M.D.   On: 03/18/2022 02:53    Procedures Procedures    Medications Ordered in ED Medications  sodium chloride (PF) 0.9 % injection (has no administration in time range)  metoCLOPramide (REGLAN) injection 10 mg (has no administration in time range)  lactated ringers infusion (has no administration in time range)  diphenhydrAMINE (BENADRYL) injection 12.5 mg (has no administration in time range)  lactated ringers bolus 1,000 mL (1,000 mLs Intravenous New Bag/Given 03/18/22 0320)  ondansetron (ZOFRAN) injection 4 mg (4 mg Intravenous Given 03/18/22 0317)  bisacodyl (DULCOLAX) suppository 10 mg (10 mg Rectal Given 03/18/22 0318)  iohexol (OMNIPAQUE) 350 MG/ML injection 100 mL (100 mLs Intravenous Contrast Given 03/18/22 0435)    ED Course/ Medical Decision Making/ A&P  Medical Decision Making Amount and/or Complexity of Data Reviewed Labs: ordered. Radiology: ordered.  Risk OTC drugs. Prescription drug management. Decision regarding hospitalization.   This patient presents to the ED for concern of abdominal pain, this involves an extensive number of treatment options, and is a complaint that carries with it a high risk of complications and morbidity.  The differential diagnosis includes constipation vs IBD vs pSBO vs SBO vs diverticulitis vs ruptured viscous   Co morbidities that complicate the patient  evaluation  Obesity    Additional history obtained:  Additional history obtained from EMS External records from outside source obtained and reviewed including CT abdomen/pelvis from 10/2020   Lab Tests:  I Ordered, and personally interpreted labs.  The pertinent results include:  Positive hemoccult, elevated D dimer of 0.77. CBC and CMP normal.   Imaging Studies ordered:  I ordered imaging studies including CT abdomen pelvis and CTA PE study  I independently visualized and interpreted imaging which showed distended jejunal loops in the left upper quadrant up to 2.8 cm maximum diameter.  Mid and distal small bowel completely decompressed. I agree with the radiologist interpretation   Cardiac Monitoring:  The patient was maintained on a cardiac monitor.  I personally viewed and interpreted the cardiac monitored which showed an underlying rhythm of: NSR   Medicines ordered and prescription drug management:  I ordered medication including Zofran and Reglan for nausea  Reevaluation of the patient after these medicines showed that the patient stayed the same I have reviewed the patients home medicines and have made adjustments as needed   Consultations Obtained:  I requested consultation with the hospitalist, MD Alcario Drought, and discussed lab and imaging findings as well as pertinent plan -which includes hospital admission for continued monitoring, bowel rest, serial exams.   Reevaluation:  After the interventions noted above, I reevaluated the patient and found that they have :stayed the same   Social Determinants of Health:  Insured patient Established care with PCP   Dispostion:  Patient continues to experience vomiting. Imaging findings suspicious for ileus, though unable to completely exclude partial small bowel obstruction. After consideration of the diagnostic results and the patients response to treatment, I feel that the patent would benefit from admission for bowel  rest and serial exams.  Case discussed with Dr. Alcario Drought of the hospitalist service who will admit the patient.         Final Clinical Impression(s) / ED Diagnoses Final diagnoses:  Intractable vomiting with nausea  Lower abdominal pain    Rx / DC Orders ED Discharge Orders     None         Antonietta Breach, PA-C 03/18/22 0617    Orpah Greek, MD 03/18/22 [phone]

## 2022-03-18 NOTE — H&P (Signed)
History and Physical    Patient: Joanna Gross WYO:[id number] DOB: 11/13/1965 DOA: 03/17/2022 DOS: the patient was seen and examined on 03/18/2022 PCP: Hayden Rasmussen, MD  Patient coming from: Home  Chief Complaint:  Chief Complaint  Patient presents with   Rectal Bleeding   HPI: LEELA VANBROCKLIN is a 56 y.o. female with medical history significant of obesity, prior iron deficiency anemia from menorrhagia from fibroids resolved s/p surgery.  No other abdominal surgeries.  Pt on Wegovy with unchanged dose for past couple of months.  Hadnt been having any side effects she says.  Recently went to [place], returned home on 02/23/22.  First symptom she developed on 02/25/22 was suprapubic abd pain.  Tele visit with PCP -> put her on cipro for presumed UTI, but never had dysuria nor urgency.  Subsequent to that developed URI symptoms including cough, SOB, DOE, cough productive of phlegm with streaks of bright red blood.  Cough has since improved significantly following a course of doxycycline and inhalers started 9/1, though she continues to have dyspnea with exertion.  However abd pain, nausea, constipation has progressively worsened.  Had hard BM 2 days ago requiring a lot of straining.  Went to use toilet tonight, strained, and passed bright red blood with no stool into toilet bowl.  Has since had hard BM following an enema with less blood in it.  Low grade fevers at home up to 100 F.  No syncope, no CP. Review of Systems: As mentioned in the history of present illness. All other systems reviewed and are negative. Past Medical History:  Diagnosis Date   Fatigue associated with anemia    History of seizure 1975   per pt had one seizure in 1975, put on medication until 1988, pt stated no seizure since   IDA (iron deficiency anemia)    followed by dr Irene Limbo (hematology)---  chronic blood loss from uterine fibroids,  treatment iron infusions   Menometrorrhagia    Pelvic pain     Seizures (HCC)    SOB (shortness of breath) on exertion    per pt due to anemia   Uterine fibroid    Past Surgical History:  Procedure Laterality Date   BREAST SURGERY Bilateral 1987;  1992;  2009   excision mass biopsy, pt stated benign   BUNIONECTOMY Bilateral 1999   CYST EXCISION  1974   left side of neck,  per pt benign   CYSTECTOMY  1974   left side of neck    HYSTEROSCOPY WITH D & C N/A 10/06/2020   Procedure: DILATATION AND CURETTAGE /HYSTEROSCOPY WITH MYOSURE;  Surgeon: Princess Bruins, MD;  Location: [facility];  Service: Gynecology;  Laterality: N/A;   MYOMECTOMY ABDOMINAL APPROACH  2003;  2015   Social History:  reports that she has never smoked. She has never used smokeless tobacco. She reports that she does not currently use alcohol. She reports that she does not use drugs.  Allergies  Allergen Reactions   Amoxicillin Anaphylaxis    Shortness of breath, boils Has patient had a PCN reaction causing immediate rash, facial/tongue/throat swelling, SOB or lightheadedness with hypotension: Yes Has patient had a PCN reaction causing severe rash involving mucus membranes or skin necrosis: Yes Has patient had a PCN reaction that required hospitalization: Yes Has patient had a PCN reaction occurring within the last 10 years: No If all of the above answers are "NO", then may proceed with Cephalosporin use.    Erythromycin Shortness Of  Breath    Shortness of breath, boils   Furosemide Anaphylaxis   Penicillins Anaphylaxis    Shortness of breath, boils  Has patient had a PCN reaction causing immediate rash, facial/tongue/throat swelling, SOB or lightheadedness with hypotension: Yes Has patient had a PCN reaction causing severe rash involving mucus membranes or skin necrosis: Yes Has patient had a PCN reaction that required hospitalization: Yes Has patient had a PCN reaction occurring within the last 10 years: No If all of the above answers are "NO", then may  proceed with Cephalosporin use.    Bee Venom Swelling   Eucalyptus Flavor [Flavoring Agent]     Hoarse voice, lip burning    Family History  Problem Relation Age of Onset   Arthritis Mother    Hypertension Father    Diabetes Father     Prior to Admission medications   Medication Sig Start Date End Date Taking? Authorizing Provider  benzonatate (TESSALON) 100 MG capsule Take 100 mg by mouth 2 (two) times daily as needed for cough. 03/08/22  Yes [provider]  cholecalciferol (VITAMIN D3) 25 MCG (1000 UNIT) tablet Take 1,000 Units by mouth daily.   Yes [provider]  EPINEPHrine 0.3 mg/0.3 mL IJ SOAJ injection Inject 0.3 mg into the muscle as needed for anaphylaxis. 02/10/22  Yes [provider]  fluticasone (FLONASE) 50 MCG/ACT nasal spray Place 2 sprays into both nostrils daily. 11/13/21  Yes [provider]  ondansetron (ZOFRAN) 8 MG tablet Take 8 mg by mouth 3 (three) times daily as needed for nausea or vomiting. 03/14/22  Yes [provider]  WEGOVY 1.7 MG/0.75ML SOAJ Inject 1.7 mg into the skin once a week. 02/13/22  Yes [provider]  Grant Ruts INHUB 250-50 MCG/ACT AEPB Inhale 1 puff into the lungs 2 (two) times daily. 03/14/22  Yes [provider]    Physical Exam: Vitals:   03/18/22 0323 03/18/22 0400 03/18/22 0500 03/18/22 0620  BP: 111/76 123/89 111/70 109/81  Pulse: 74 78 95 (!) 104  Resp: '16  16 17  '$ Temp: 97.9 F (36.6 C)     TempSrc: Oral     SpO2: 100% 99% 99% 100%  Weight:      Height:       Constitutional: NAD, calm, comfortable Eyes: PERRL, lids and conjunctivae normal ENMT: Mucous membranes are moist. Posterior pharynx clear of any exudate or lesions.Normal dentition.  Neck: normal, supple, no masses, no thyromegaly Respiratory: clear to auscultation bilaterally, no wheezing, no crackles. Normal respiratory effort. No accessory muscle use.  Cardiovascular: Regular rate and rhythm, no murmurs / rubs  / gallops. No extremity edema. 2+ pedal pulses. No carotid bruits.  Abdomen: Generalized TTP, no guarding, no rebound. Musculoskeletal: no clubbing / cyanosis. No joint deformity upper and lower extremities. Good ROM, no contractures. Normal muscle tone.  Skin: no rashes, lesions, ulcers. No induration Neurologic: CN 2-12 grossly intact. Sensation intact, DTR normal. Strength 5/5 in all 4.  Psychiatric: Normal judgment and insight. Alert and oriented x 3. Normal mood.   Data Reviewed:    CTA chest abd pelvis: IMPRESSION: 1. No CT evidence for acute pulmonary embolus. 2. No acute findings in the chest. 3. Jejunal loops in the left upper quadrant are fluid-filled and mildly distended up to about 2.8 cm maximum diameter. No associated wall thickening or substantial perienteric edema/inflammation. No abrupt or discrete transition zone is evident although mid and distal small bowel is completely decompressed. Imaging features considered nonspecific but may reflect a  component of underlying ileus or enteritis. 4. Cholelithiasis without evidence for cholecystitis. 5. No substantial stool volume to suggest clinical constipation. No colonic mass lesion by CT imaging. 6. Fibroid uterus.  CBC    Component Value Date/Time   WBC 10.0 03/17/2022 2207   RBC 4.83 03/17/2022 2207   HGB 14.9 03/17/2022 2207   HGB 10.7 (L) 06/04/2020 1343   HCT 46.8 (H) 03/17/2022 2207   HCT 36.0 06/04/2020 1344   PLT 158 03/17/2022 2207   PLT 249 06/04/2020 1343   MCV 96.9 03/17/2022 2207   MCV 79.1 (A) 08/06/2013 1534   MCH 30.8 03/17/2022 2207   MCHC 31.8 03/17/2022 2207   RDW 13.5 03/17/2022 2207   LYMPHSABS 1.5 10/02/2020 1058   MONOABS 0.4 10/02/2020 1058   EOSABS 0.1 10/02/2020 1058   BASOSABS 0.0 10/02/2020 1058   CMP     Component Value Date/Time   NA 136 03/17/2022 2207   K 4.5 03/17/2022 2207   CL 103 03/17/2022 2207   CO2 24 03/17/2022 2207   GLUCOSE 99 03/17/2022 2207   BUN 7  03/17/2022 2207   CREATININE 0.71 03/17/2022 2207   CREATININE 0.73 10/02/2020 1058   CREATININE 0.63 08/06/2013 1532   CALCIUM 10.3 03/17/2022 2207   PROT 8.0 03/17/2022 2207   ALBUMIN 4.4 03/17/2022 2207   AST 19 03/17/2022 2207   AST 22 10/02/2020 1058   ALT 19 03/17/2022 2207   ALT 34 10/02/2020 1058   ALKPHOS 72 03/17/2022 2207   BILITOT 0.7 03/17/2022 2207   BILITOT 0.3 10/02/2020 1058   GFRNONAA >60 03/17/2022 2207   GFRNONAA >60 10/02/2020 1058   GFRAA >60 03/25/2020 1409     Assessment and Plan: * Ileus ([place]) Decreased motility, N/V, and constipation.  CT scan question of ileus.  Initially was wondering if [name] might be contributing but she had been on stable Wegovy dose without issue for a couple of months prior to onset of symptoms and symptoms continue to worsen despite being off of [facility] for past 2 weeks. Possibly due to recent URI illness?  GI symptoms continue to worsen despite improvement in respiratory symptoms though. Continue to hold Wegovy NPO IVF Given BRBPR + GI findings will put in for GI consult for AM. Though BRBPR may just be internal hemorrhoid from the sounds of it, not really sure why ileus + constipation symptoms continue to worsen. If nothing else though, this is a 56 yo who has never had a screening colonoscopy, and therefore is presumably overdue for this as outpt.  Hematochezia Constipated BM followed by BRBPR earlier today. Suspect internal hemorrhoid most likely from the constipation. Monitor for further bleeding episodes Repeat CBC in AM SCDs only for DVT ppx Probably needs GI follow up at a minimum though.  Bronchitis Cough and URI symptoms now slowly starting to improve following course of doxycycline and nebs. CTA chest today neg for PE or other acute finding, PNA etc. COVID neg Dulera Albuterol PRN Cont tessalon for cough suppression Since improving already, will hold off on much more in the way of work up for the  moment.      Advance Care Planning:   Code Status: Full Code  Consults: Message sent to Dr. Paulita Fujita and Dr. Watt Climes (for outpt colonoscopy since she is overdue if nothing else).  Family Communication: Family at bedside  Severity of Illness: The appropriate patient status for this patient is OBSERVATION. Observation status is judged to be reasonable and necessary in order to provide  the required intensity of service to ensure the patient's safety. The patient's presenting symptoms, physical exam findings, and initial radiographic and laboratory data in the context of their medical condition is felt to place them at decreased risk for further clinical deterioration. Furthermore, it is anticipated that the patient will be medically stable for discharge from the hospital within 2 midnights of admission.   Author: Etta Quill., DO 03/18/2022 6:34 AM  For on call review [email or web address].

## 2022-03-18 NOTE — Consult Note (Signed)
Referring Provider: [facility] Primary Care Physician:  Hayden Rasmussen, MD Primary Gastroenterologist:  Althia Forts  Reason for Consultation:  Rectal bleeding  HPI: Joanna Gross is a 56 y.o. female  with medical history significant of obesity, seizures, prior iron deficiency anemia from menorrhagia from fibroids resolved s/p surgery.   Patient presented to the emergency room today for worsening abdominal pain for 2 weeks.  Patient initially noticed the pain after returning from a trip to [place] on 02/25/2022.  Around that time she had also developed an upper respiratory infection  which was successfully treated with doxycycline and inhalers.  Her abdominal pain has continued to worsen throughout this time.    Pain is located in the bilateral lower abdomen notes the pain is sharp and constant.  The pain improved for short amount of time after starting antibiotics for her upper respiratory infection, after stopping her antibiotics the pain returned and has worsened.  She notes associated nausea and constipation with lower abdominal pain. Last emesis 1 week ago for 2 days, no vomiting since but she has continued nausea.  Also reports coughing up small amounts of blood 1 week ago for 2 days, has not coughed up any blood since.  On Saturday she had a recorded fever of 100.8 at home.  Denies fevers since this time but reports she will still have episodes of increased sweating and chills.   Notes 2 days ago she passed a hard stool the past with straining, no blood was present.  Yesterday she noticed moderate-sized amount of bright red blood after using an enema to have a bowel movement.  She has since noticed some bright red blood without having a bowel movement. Denies previous episodes of melena, hematochezia.   Reports negative Cologuard in May.  Denies family history of colon cancer.  Denies family history of GI issues including IBD.   She was previously taking Wegovy for several months for her  diabetes but has since stopped 2 weeks ago with no improvement in symptoms. She is allergic to eucalyptus and will take methylprednisolone Dosepaks 5-6 times a year due to this allergy.  Her last Dosepak was on 8/30 and ended about 1 week ago. Is not taking any blood thinning medications at home, denies NSAID use. Denies smoking, denies alcohol use and IV drug use. Denies previous colonoscopy or EGD. Denies previous GI issues.  She typically has very normal regular bowel movements.   Past Medical History:  Diagnosis Date   Fatigue associated with anemia    History of seizure 1975   per pt had one seizure in 1975, put on medication until 1988, pt stated no seizure since   IDA (iron deficiency anemia)    followed by dr Irene Limbo (hematology)---  chronic blood loss from uterine fibroids,  treatment iron infusions   Menometrorrhagia    Pelvic pain    Seizures (HCC)    SOB (shortness of breath) on exertion    per pt due to anemia   Uterine fibroid     Past Surgical History:  Procedure Laterality Date   BREAST SURGERY Bilateral 1987;  1992;  2009   excision mass biopsy, pt stated benign   BUNIONECTOMY Bilateral 1999   CYST EXCISION  1974   left side of neck,  per pt benign   CYSTECTOMY  1974   left side of neck    HYSTEROSCOPY WITH D & C N/A 10/06/2020   Procedure: DILATATION AND CURETTAGE /HYSTEROSCOPY WITH MYOSURE;  Surgeon: Princess Bruins, MD;  Location: [facility];  Service: Gynecology;  Laterality: N/A;   MYOMECTOMY ABDOMINAL APPROACH  2003;  2015    Prior to Admission medications   Medication Sig Start Date End Date Taking? Authorizing Provider  benzonatate (TESSALON) 100 MG capsule Take 100 mg by mouth 2 (two) times daily as needed for cough. 03/08/22  Yes [provider]  cholecalciferol (VITAMIN D3) 25 MCG (1000 UNIT) tablet Take 1,000 Units by mouth daily.   Yes [provider]  EPINEPHrine 0.3 mg/0.3 mL IJ SOAJ injection Inject 0.3 mg into  the muscle as needed for anaphylaxis. 02/10/22  Yes [provider]  fluticasone (FLONASE) 50 MCG/ACT nasal spray Place 2 sprays into both nostrils daily. 11/13/21  Yes [provider]  ondansetron (ZOFRAN) 8 MG tablet Take 8 mg by mouth 3 (three) times daily as needed for nausea or vomiting. 03/14/22  Yes [provider]  WEGOVY 1.7 MG/0.75ML SOAJ Inject 1.7 mg into the skin once a week. 02/13/22  Yes [provider]  Grant Ruts INHUB 250-50 MCG/ACT AEPB Inhale 1 puff into the lungs 2 (two) times daily. 03/14/22  Yes [provider]    Scheduled Meds:  mometasone-formoterol  2 puff Inhalation BID   polyethylene glycol  17 g Oral BID   sodium chloride (PF)       Continuous Infusions:  lactated ringers     PRN Meds:.acetaminophen **OR** acetaminophen, albuterol, benzonatate, ondansetron **OR** ondansetron (ZOFRAN) IV, sodium chloride (PF)  Allergies as of 03/17/2022 - Review Complete 03/17/2022  Allergen Reaction Noted   Amoxicillin Anaphylaxis 05/24/2011   Erythromycin Shortness Of Breath 05/24/2011   Furosemide Anaphylaxis 03/06/2018   Penicillins Anaphylaxis 05/24/2011   Bee venom Swelling 03/26/2018   Eucalyptus flavor [flavoring agent]  03/26/2018    Family History  Problem Relation Age of Onset   Arthritis Mother    Hypertension Father    Diabetes Father     Social History   Socioeconomic History   Marital status: Divorced    Spouse name: n/a   Number of children: 0   Years of education: Not on file   Highest education level: Not on file  Occupational History   Occupation: [employer]    Comment: self-employed  Tobacco Use   Smoking status: Never   Smokeless tobacco: Never  Vaping Use   Vaping Use: Never used  Substance and Sexual Activity   Alcohol use: Not Currently   Drug use: Never   Sexual activity: Not Currently    Partners: Male    Birth control/protection: Abstinence  Other Topics Concern   Not on file  Social  History Narrative   Not on file   Social Determinants of Health   Financial Resource Strain: Not on file  Food Insecurity: Not on file  Transportation Needs: Not on file  Physical Activity: Not on file  Stress: Not on file  Social Connections: Not on file  Intimate Partner Violence: Not on file    Review of Systems: All negative except as stated above in HPI.  Physical Exam:Physical Exam Constitutional:      General: She is not in acute distress.    Appearance: Normal appearance. She is normal weight.  HENT:     Head: Normocephalic and atraumatic.     Right Ear: External ear normal.     Left Ear: External ear normal.     Nose: Nose normal.     Mouth/Throat:     Mouth: Mucous membranes are moist.  Eyes:  Pupils: Pupils are equal, round, and reactive to light.  Cardiovascular:     Rate and Rhythm: Normal rate and regular rhythm.     Pulses: Normal pulses.     Heart sounds: Normal heart sounds.  Pulmonary:     Effort: Pulmonary effort is normal.     Breath sounds: Normal breath sounds.  Abdominal:     General: Abdomen is flat. Bowel sounds are normal. There is no distension.     Palpations: Abdomen is soft. There is no mass.     Tenderness: There is abdominal tenderness (generalized). There is no guarding or rebound.     Hernia: No hernia is present.  Musculoskeletal:        General: No swelling. Normal range of motion.     Cervical back: Normal range of motion and neck supple.  Skin:    General: Skin is warm and dry.     Coloration: Skin is not pale.  Neurological:     General: No focal deficit present.     Mental Status: She is alert and oriented to person, place, and time. Mental status is at baseline.  Psychiatric:        Mood and Affect: Mood normal.        Behavior: Behavior normal.     Vital signs: Vitals:   03/18/22 0700 03/18/22 0800  BP: 110/76 114/76  Pulse: 98 95  Resp: 16 16  Temp:    SpO2: 97% 98%        GI:  Lab Results: Recent Labs     03/17/22 2207  WBC 10.0  HGB 14.9  HCT 46.8*  PLT 158   BMET Recent Labs    03/17/22 2207  NA 136  K 4.5  CL 103  CO2 24  GLUCOSE 99  BUN 7  CREATININE 0.71  CALCIUM 10.3   LFT Recent Labs    03/17/22 2207  PROT 8.0  ALBUMIN 4.4  AST 19  ALT 19  ALKPHOS 72  BILITOT 0.7   PT/INR No results for input(s): "LABPROT", "INR" in the last 72 hours.   Studies/Results: CT Angio Chest PE W and/or Wo Contrast  Result Date: 03/18/2022 CLINICAL DATA:  Recent travel with upper respiratory symptoms. Rectal bleeding. EXAM: CT ANGIOGRAPHY CHEST CT ABDOMEN AND PELVIS WITH CONTRAST TECHNIQUE: Multidetector CT imaging of the chest was performed using the standard protocol during bolus administration of intravenous contrast. Multiplanar CT image reconstructions and MIPs were obtained to evaluate the vascular anatomy. Multidetector CT imaging of the abdomen and pelvis was performed using the standard protocol during bolus administration of intravenous contrast. RADIATION DOSE REDUCTION: This exam was performed according to the departmental dose-optimization program which includes automated exposure control, adjustment of the mA and/or kV according to patient size and/or use of iterative reconstruction technique. CONTRAST:  113m OMNIPAQUE IOHEXOL 350 MG/ML SOLN COMPARISON:  Abdomen pelvis CT 10/19/2020 FINDINGS: CTA CHEST FINDINGS Cardiovascular: The heart size is upper normal. No substantial pericardial effusion. No thoracic aortic aneurysm. There is no filling defect within the opacified pulmonary arteries to suggest the presence of an acute pulmonary embolus. Mediastinum/Nodes: No mediastinal lymphadenopathy. There is no hilar lymphadenopathy. The esophagus has normal imaging features. There is no axillary lymphadenopathy. Lungs/Pleura: No suspicious pulmonary nodule or mass. No focal airspace consolidation. No pulmonary edema pleural effusion. Skip No worrisome lytic or sclerotic osseous  abnormality. Musculoskeletal: Review of the MIP images confirms the above findings. CT ABDOMEN and PELVIS FINDINGS Hepatobiliary: No suspicious focal abnormality within the  liver parenchyma. Small area of low attenuation in the anterior liver, adjacent to the falciform ligament, is in a characteristic location for focal fatty deposition. 3 mm gallstone identified towards the neck of the gallbladder. No intrahepatic or extrahepatic biliary dilation. Pancreas: No focal mass lesion. No dilatation of the main duct. No intraparenchymal cyst. No peripancreatic edema. Spleen: No splenomegaly. No focal mass lesion. Adrenals/Urinary Tract: No adrenal nodule or mass. Kidneys unremarkable. No evidence for hydroureter. The urinary bladder appears normal for the degree of distention. Stomach/Bowel: Stomach is mildly distended with fluid. Duodenum is normally positioned as is the ligament of Treitz. Jejunal loops in the left upper quadrant are fluid-filled and mildly distended up to about 2.8 cm maximum diameter. No associated wall thickening or substantial perienteric edema/inflammation. No abrupt or discrete transition zone is evident although mid and distal small bowel is completely decompressed. The terminal ileum is normal. The appendix is not well visualized, but there is no edema or inflammation in the region of the cecum. No gross colonic mass. No colonic wall thickening. No substantial stool volume to suggest clinical constipation. Vascular/Lymphatic: No abdominal aortic aneurysm. There is no gastrohepatic or hepatoduodenal ligament lymphadenopathy. No retroperitoneal or mesenteric lymphadenopathy. No pelvic sidewall lymphadenopathy. Reproductive: Uterus is enlarged secondary to numerous fibroids. There is no adnexal mass. Other: No intraperitoneal free fluid. Musculoskeletal: No worrisome lytic or sclerotic osseous abnormality. Review of the MIP images confirms the above findings. IMPRESSION: 1. No CT evidence for acute  pulmonary embolus. 2. No acute findings in the chest. 3. Jejunal loops in the left upper quadrant are fluid-filled and mildly distended up to about 2.8 cm maximum diameter. No associated wall thickening or substantial perienteric edema/inflammation. No abrupt or discrete transition zone is evident although mid and distal small bowel is completely decompressed. Imaging features considered nonspecific but may reflect a component of underlying ileus or enteritis. 4. Cholelithiasis without evidence for cholecystitis. 5. No substantial stool volume to suggest clinical constipation. No colonic mass lesion by CT imaging. 6. Fibroid uterus. Electronically Signed   By: Misty Stanley M.D.   On: 03/18/2022 05:15   CT ABDOMEN PELVIS W CONTRAST  Result Date: 03/18/2022 CLINICAL DATA:  Recent travel with upper respiratory symptoms. Rectal bleeding. EXAM: CT ANGIOGRAPHY CHEST CT ABDOMEN AND PELVIS WITH CONTRAST TECHNIQUE: Multidetector CT imaging of the chest was performed using the standard protocol during bolus administration of intravenous contrast. Multiplanar CT image reconstructions and MIPs were obtained to evaluate the vascular anatomy. Multidetector CT imaging of the abdomen and pelvis was performed using the standard protocol during bolus administration of intravenous contrast. RADIATION DOSE REDUCTION: This exam was performed according to the departmental dose-optimization program which includes automated exposure control, adjustment of the mA and/or kV according to patient size and/or use of iterative reconstruction technique. CONTRAST:  136m OMNIPAQUE IOHEXOL 350 MG/ML SOLN COMPARISON:  Abdomen pelvis CT 10/19/2020 FINDINGS: CTA CHEST FINDINGS Cardiovascular: The heart size is upper normal. No substantial pericardial effusion. No thoracic aortic aneurysm. There is no filling defect within the opacified pulmonary arteries to suggest the presence of an acute pulmonary embolus. Mediastinum/Nodes: No mediastinal  lymphadenopathy. There is no hilar lymphadenopathy. The esophagus has normal imaging features. There is no axillary lymphadenopathy. Lungs/Pleura: No suspicious pulmonary nodule or mass. No focal airspace consolidation. No pulmonary edema pleural effusion. Skip No worrisome lytic or sclerotic osseous abnormality. Musculoskeletal: Review of the MIP images confirms the above findings. CT ABDOMEN and PELVIS FINDINGS Hepatobiliary: No suspicious focal abnormality within the liver parenchyma.  Small area of low attenuation in the anterior liver, adjacent to the falciform ligament, is in a characteristic location for focal fatty deposition. 3 mm gallstone identified towards the neck of the gallbladder. No intrahepatic or extrahepatic biliary dilation. Pancreas: No focal mass lesion. No dilatation of the main duct. No intraparenchymal cyst. No peripancreatic edema. Spleen: No splenomegaly. No focal mass lesion. Adrenals/Urinary Tract: No adrenal nodule or mass. Kidneys unremarkable. No evidence for hydroureter. The urinary bladder appears normal for the degree of distention. Stomach/Bowel: Stomach is mildly distended with fluid. Duodenum is normally positioned as is the ligament of Treitz. Jejunal loops in the left upper quadrant are fluid-filled and mildly distended up to about 2.8 cm maximum diameter. No associated wall thickening or substantial perienteric edema/inflammation. No abrupt or discrete transition zone is evident although mid and distal small bowel is completely decompressed. The terminal ileum is normal. The appendix is not well visualized, but there is no edema or inflammation in the region of the cecum. No gross colonic mass. No colonic wall thickening. No substantial stool volume to suggest clinical constipation. Vascular/Lymphatic: No abdominal aortic aneurysm. There is no gastrohepatic or hepatoduodenal ligament lymphadenopathy. No retroperitoneal or mesenteric lymphadenopathy. No pelvic sidewall  lymphadenopathy. Reproductive: Uterus is enlarged secondary to numerous fibroids. There is no adnexal mass. Other: No intraperitoneal free fluid. Musculoskeletal: No worrisome lytic or sclerotic osseous abnormality. Review of the MIP images confirms the above findings. IMPRESSION: 1. No CT evidence for acute pulmonary embolus. 2. No acute findings in the chest. 3. Jejunal loops in the left upper quadrant are fluid-filled and mildly distended up to about 2.8 cm maximum diameter. No associated wall thickening or substantial perienteric edema/inflammation. No abrupt or discrete transition zone is evident although mid and distal small bowel is completely decompressed. Imaging features considered nonspecific but may reflect a component of underlying ileus or enteritis. 4. Cholelithiasis without evidence for cholecystitis. 5. No substantial stool volume to suggest clinical constipation. No colonic mass lesion by CT imaging. 6. Fibroid uterus. Electronically Signed   By: Misty Stanley M.D.   On: 03/18/2022 05:15   DG Chest Port 1 View  Result Date: 03/18/2022 CLINICAL DATA:  Cough and shortness of breath. EXAM: PORTABLE CHEST 1 VIEW COMPARISON:  August 06, 2013 FINDINGS: The heart size and mediastinal contours are within normal limits. Both lungs are clear. The visualized skeletal structures are unremarkable. IMPRESSION: No active disease. Electronically Signed   By: Virgina Norfolk M.D.   On: 03/18/2022 02:53    Impression: Abdominal pain Hematochezia Ileus  HGB 14.9 WBC 10.0 Platelets 158 AST 19 ALT 19  Alkphos 72 TBili 0.7  CT Angio chest, abdomen and pelvis 03/18/2022 No CT evidence for acute pulmonary embolus. 2. No acute findings in the chest. 3. Jejunal loops in the left upper quadrant are fluid-filled and mildly distended up to about 2.8 cm maximum diameter. No associated wall thickening or substantial perienteric edema/inflammation. No abrupt or discrete transition zone is evident although mid  and distal small bowel is completely decompressed. Imaging features considered nonspecific but may reflect a component of underlying ileus or enteritis. 4. Cholelithiasis without evidence for cholecystitis. 5. No substantial stool volume to suggest clinical constipation. No colonic mass lesion by CT imaging. 6. Fibroid uterus  FOBT positive in the ED. nursing reported dark red blood, no melena.  Patient symptoms of new onset constipation, hematochezia for 2 days, abdominal pain, nausea, vomiting.  CT scans with possible ileus or enteritis.  Possible onset of symptoms due  to [facility], recent upper respiratory infection, less likely GI malignancy.  Possible hematochezia due to internal hemorrhoids or diverticular bleed, patient with no history of colonoscopy, negative family history.  Hgb stable.  Nausea improved with antiemetics.  CT scan without significant stool burden.   Plan: Continue antiemetics Continue n.p.o. for possible ileus. Continue IV fluids. If no improvement in ileus or continues to have hematochezia, develops anemia may consider inpatient colonoscopy over the weekend or possibly Monday.  Otherwise recommend outpatient GI follow-up and colonoscopy.  [facility] will follow.   LOS: 0 days   Charlott Rakes  PA-C 03/18/2022, 8:28 AM  Contact #  [phone]

## 2022-03-19 LAB — CBC
HCT: 37.3 % (ref 36.0–46.0)
Hemoglobin: 12.2 g/dL (ref 12.0–15.0)
MCH: 31.2 pg (ref 26.0–34.0)
MCHC: 32.7 g/dL (ref 30.0–36.0)
MCV: 95.4 fL (ref 80.0–100.0)
Platelets: 143 10*3/uL — ABNORMAL LOW (ref 150–400)
RBC: 3.91 MIL/uL (ref 3.87–5.11)
RDW: 14 % (ref 11.5–15.5)
WBC: 4.9 10*3/uL (ref 4.0–10.5)
nRBC: 0 % (ref 0.0–0.2)

## 2022-03-19 LAB — BASIC METABOLIC PANEL
Anion gap: 6 (ref 5–15)
BUN: <5 mg/dL — ABNORMAL LOW (ref 6–20)
CO2: 27 mmol/L (ref 22–32)
Calcium: 9.6 mg/dL (ref 8.9–10.3)
Chloride: 109 mmol/L (ref 98–111)
Creatinine, Ser: 0.72 mg/dL (ref 0.44–1.00)
GFR, Estimated: >60 mL/min (ref >60)
Glucose, Bld: 91 mg/dL (ref 70–99)
Potassium: 3.6 mmol/L (ref 3.5–5.1)
Sodium: 142 mmol/L (ref 135–145)

## 2022-03-19 LAB — HIV ANTIBODY (ROUTINE TESTING W REFLEX): HIV Screen 4th Generation wRfx: NONREACTIVE

## 2022-03-19 MED ORDER — BENZONATATE 100 MG PO CAPS
200.0000 mg | ORAL_CAPSULE | Freq: Three times a day (TID) | ORAL | Status: DC
  Administered 2022-03-19 – 2022-03-20 (×4): 200 mg via ORAL
  Filled 2022-03-19 (×4): qty 2

## 2022-03-19 MED ORDER — HYDROCOD POLI-CHLORPHE POLI ER 10-8 MG/5ML PO SUER
5.0000 mL | Freq: Two times a day (BID) | ORAL | Status: DC
  Administered 2022-03-19 – 2022-03-20 (×3): 5 mL via ORAL
  Filled 2022-03-19 (×3): qty 5

## 2022-03-19 NOTE — Progress Notes (Signed)
Joanna Gross 7:51 AM  Subjective: Patient doing a little better tolerating clear liquids did have a bowel movement and is passing gas but did see a little blood in her stool no new complaints and no nausea or vomiting  Objective: Vital signs stable afebrile no acute distress abdomen is soft minimal discomfort only labs okay  Assessment: Jejunal abnormality on CT and some bright red blood per rectum  Plan: She is willing to try to advance her diet which I think is fine however return to clear liquids if nausea and vomiting and will check on tomorrow  [facility]  office [phone] After 5PM or if no answer call [phone]

## 2022-03-19 NOTE — Progress Notes (Signed)
[facility]                                                                              Ericka Marcellus, is a 56 y.o. female, DOB - 08/29/1965, NIO:[id number] Admit date - 03/17/2022    Outpatient Primary MD for the patient is Darron Doom, Maebelle Munroe, MD  LOS - 1  days  Chief Complaint  Patient presents with   Rectal Bleeding       Brief summary   Patient is a 56 year old female with history of obesity, who has been having some nonspecific GI and respiratory symptoms following a trip in [place].  She returned on August 23, and on the 25th she started experiencing intermittent abdominal discomfort, nausea, and her GI symptoms have progressed now to the point that she has difficulties with p.o. intake, persistent nausea and started having emesis with 1 episode yesterday.  She is also been having intermittent shortness of breath and nonspecific URI symptoms, for which she completed a course of doxycycline as an outpatient, and now her respiratory symptoms have improved.  She is also has noticed bright red blood per rectum, when she wipes but also mixed with stool and also bleeding by itself.  Due to persistent GI issues she came to the hospital.   CT scan on admission with potentially enteritis in the left upper quadrant jejunal loops which are mildly distended without associated wall thickening or substantial edema, potentially representing underlying ileus versus enteritis.   Of note, she was started on Wegovy earlier this year for weight loss, and prior to [place] trip she was tolerating that well.   Assessment & Plan    Principal Problem: Enteritis/ileus, acute ([place]) -History of recent travel, [place], [place] with her friends who also have nonspecific similar symptoms GI/respiratory.  COVID test negative. -CT abdomen showed enteritis/ileus -Continues to have lower abdominal pain, mild nausea, no diarrhea. -GI following, tolerating clear liquid diet, advance to soft solids.   Invasive intervention per gastroenterology   Active Problems:   Hematochezia -Possibly due to hemorrhoids.  Per patient, she had no prior history of constipation however since returning from heart travel, she had constipation followed by [facility] -Continue Anusol suppository  Acute bronchitis -Still having cough although no shortness of breath or wheezing.  CTA chest showed no PE or pneumonia -Continue Dulera, placed on Tussionex, continue Tessalon Perles, increase to 200 mg 3 times daily   Obesity Estimated body mass index is 38.75 kg/m as calculated from the following:   Height as of this encounter: '5\' 6"'$  (1.676 m).   Weight as of this encounter: 108.9 kg.  Code Status: Full code DVT Prophylaxis:  SCDs Start: 03/18/22 0608   Level of Care: Level of care: Med-Surg Family Communication: Updated patient's mother at the bedside   Disposition Plan:      Remains inpatient appropriate: Work-up in progress   Procedures:  None  Consultants:   GI   Antimicrobials: None   Medications  benzonatate  200 mg Oral TID   chlorpheniramine-HYDROcodone  5 mL Oral Q12H   hydrocortisone  25 mg Rectal BID   mometasone-formoterol  2 puff Inhalation BID  polyethylene glycol  17 g Oral BID      Subjective:   Raylinn Kosar was seen and examined today.  No vomiting, tolerating clear liquid diet.  Still has lower abdominal pain,  no fevers or chills.  No chest pain or acute shortness of breath.  Still has coughing.    Objective:   Vitals:   03/18/22 1641 03/18/22 2120 03/19/22 0137 03/19/22 0523  BP: 109/71 113/68 122/67 124/64  Pulse: 91 99 81 67  Resp: '18 18 14 14  '$ Temp: 98.5 F (36.9 C) 97.8 F (36.6 C) 98 F (36.7 C) 98 F (36.7 C)  TempSrc: Oral Oral Oral Oral  SpO2: 98% 98% 96% 100%  Weight:      Height:        Intake/Output Summary (Last 24 hours) at 03/19/2022 1053 Last data filed at 03/19/2022 1000 Gross per 24 hour  Intake 2373.49 ml  Output 0 ml  Net 2373.49  ml     Wt Readings from Last 3 Encounters:  03/17/22 108.9 kg  10/19/20 108.9 kg  10/19/20 110.7 kg     Exam General: Alert and oriented x 3, NAD Cardiovascular: S1 S2 auscultated,  RRR Respiratory: Clear to auscultation bilaterally, no wheezing, rales Gastrointestinal: Soft, lower abdominal TTP, ND, NBS  Ext: no pedal edema bilaterally Neuro: no new deficits Psych: Normal affect and demeanor, alert and oriented x3     Data Reviewed:  I have personally reviewed following labs    CBC Lab Results  Component Value Date   WBC 4.9 03/19/2022   RBC 3.91 03/19/2022   HGB 12.2 03/19/2022   HCT 37.3 03/19/2022   MCV 95.4 03/19/2022   MCH 31.2 03/19/2022   PLT 143 (L) 03/19/2022   MCHC 32.7 03/19/2022   RDW 14.0 03/19/2022   LYMPHSABS 1.5 10/02/2020   MONOABS 0.4 10/02/2020   EOSABS 0.1 10/02/2020   BASOSABS 0.0 57/32/2025     Last metabolic panel Lab Results  Component Value Date   NA 142 03/19/2022   K 3.6 03/19/2022   CL 109 03/19/2022   CO2 27 03/19/2022   BUN <5 (L) 03/19/2022   CREATININE 0.72 03/19/2022   GLUCOSE 91 03/19/2022   GFRNONAA >60 03/19/2022   GFRAA >60 03/25/2020   CALCIUM 9.6 03/19/2022   PROT 8.0 03/17/2022   ALBUMIN 4.4 03/17/2022   BILITOT 0.7 03/17/2022   ALKPHOS 72 03/17/2022   AST 19 03/17/2022   ALT 19 03/17/2022   ANIONGAP 6 03/19/2022     Radiology Studies: I have personally reviewed the imaging studies  CT Angio Chest PE W and/or Wo Contrast  Result Date: 03/18/2022 CLINICAL DATA:  Recent travel with upper respiratory symptoms. Rectal bleeding. IMPRESSION: 1. No CT evidence for acute pulmonary embolus. 2. No acute findings in the chest. 3. Jejunal loops in the left upper quadrant are fluid-filled and mildly distended up to about 2.8 cm maximum diameter. No associated wall thickening or substantial perienteric edema/inflammation. No abrupt or discrete transition zone is evident although mid and distal small bowel is completely  decompressed. Imaging features considered nonspecific but may reflect a component of underlying ileus or enteritis. 4. Cholelithiasis without evidence for cholecystitis. 5. No substantial stool volume to suggest clinical constipation. No colonic mass lesion by CT imaging. 6. Fibroid uterus. Electronically Signed   By: Misty Stanley M.D.   On: 03/18/2022 05:15   CT ABDOMEN PELVIS W CONTRAST  Result Date: 03/18/2022 CLINICAL DATA:  Recent travel with upper respiratory symptoms. Rectal bleeding  IMPRESSION: 1. No CT evidence for acute pulmonary embolus. 2. No acute findings in the chest. 3. Jejunal loops in the left upper quadrant are fluid-filled and mildly distended up to about 2.8 cm maximum diameter. No associated wall thickening or substantial perienteric edema/inflammation. No abrupt or discrete transition zone is evident although mid and distal small bowel is completely decompressed. Imaging features considered nonspecific but may reflect a component of underlying ileus or enteritis. 4. Cholelithiasis without evidence for cholecystitis. 5. No substantial stool volume to suggest clinical constipation. No colonic mass lesion by CT imaging. 6. Fibroid uterus. Electronically Signed   By: Misty Stanley M.D.   On: 03/18/2022 05:15   DG Chest Port 1 View  Result Date: 03/18/2022 CLINICAL DATA:  Cough and shortness of breath. IMPRESSION: No active disease. Electronically Signed   By: Virgina Norfolk M.D.   On: 03/18/2022 02:53       Juda Lajeunesse M.D. [facility] 03/19/2022, 10:53 AM  Available via Epic secure chat 7am-7pm After 7 pm, please refer to night coverage provider listed on amion.

## 2022-03-20 LAB — CBC WITH DIFFERENTIAL/PLATELET
Abs Immature Granulocytes: 0 10*3/uL (ref 0.00–0.07)
Basophils Absolute: 0 10*3/uL (ref 0.0–0.1)
Basophils Relative: 1 %
Eosinophils Absolute: 0.1 10*3/uL (ref 0.0–0.5)
Eosinophils Relative: 3 %
HCT: 37.4 % (ref 36.0–46.0)
Hemoglobin: 11.9 g/dL — ABNORMAL LOW (ref 12.0–15.0)
Immature Granulocytes: 0 %
Lymphocytes Relative: 36 %
Lymphs Abs: 1.4 10*3/uL (ref 0.7–4.0)
MCH: 30.9 pg (ref 26.0–34.0)
MCHC: 31.8 g/dL (ref 30.0–36.0)
MCV: 97.1 fL (ref 80.0–100.0)
Monocytes Absolute: 0.4 10*3/uL (ref 0.1–1.0)
Monocytes Relative: 11 %
Neutro Abs: 1.9 10*3/uL (ref 1.7–7.7)
Neutrophils Relative %: 49 %
Platelets: 145 10*3/uL — ABNORMAL LOW (ref 150–400)
RBC: 3.85 MIL/uL — ABNORMAL LOW (ref 3.87–5.11)
RDW: 13.8 % (ref 11.5–15.5)
WBC: 3.9 10*3/uL — ABNORMAL LOW (ref 4.0–10.5)
nRBC: 0 % (ref 0.0–0.2)

## 2022-03-20 MED ORDER — ONDANSETRON HCL 8 MG PO TABS: 8.0000 mg | ORAL_TABLET | Freq: Three times a day (TID) | ORAL | 0 refills | Status: AC | PRN

## 2022-03-20 MED ORDER — BENZONATATE 200 MG PO CAPS: 200.0000 mg | ORAL_CAPSULE | Freq: Three times a day (TID) | ORAL | 0 refills | Status: AC | PRN

## 2022-03-20 MED ORDER — DICYCLOMINE HCL 10 MG PO CAPS
10.0000 mg | ORAL_CAPSULE | Freq: Once | ORAL | Status: AC
  Administered 2022-03-20: 10 mg via ORAL
  Filled 2022-03-20: qty 1

## 2022-03-20 MED ORDER — POLYETHYLENE GLYCOL 3350 17 G PO PACK: 17.0000 g | PACK | Freq: Every day | ORAL | 0 refills | Status: AC

## 2022-03-20 MED ORDER — HYDROCOD POLI-CHLORPHE POLI ER 10-8 MG/5ML PO SUER: 5.0000 mL | Freq: Two times a day (BID) | ORAL | 0 refills | Status: AC | PRN

## 2022-03-20 MED ORDER — HYDROCORTISONE ACETATE 25 MG RE SUPP: 25.0000 mg | Freq: Two times a day (BID) | RECTAL | 1 refills | Status: AC

## 2022-03-20 MED ORDER — WEGOVY 1.7 MG/0.75ML ~~LOC~~ SOAJ: 1.7000 mg | SUBCUTANEOUS | Status: AC

## 2022-03-20 MED ORDER — DOCUSATE SODIUM 100 MG PO CAPS: 100.0000 mg | ORAL_CAPSULE | Freq: Two times a day (BID) | ORAL | 2 refills | Status: AC | PRN

## 2022-03-20 MED ORDER — TRAMADOL HCL 50 MG PO TABS: 50.0000 mg | ORAL_TABLET | Freq: Three times a day (TID) | ORAL | 0 refills | Status: AC | PRN

## 2022-03-20 MED ORDER — DICYCLOMINE HCL 10 MG PO CAPS: 10.0000 mg | ORAL_CAPSULE | Freq: Three times a day (TID) | ORAL | 1 refills | Status: AC | PRN

## 2022-03-20 NOTE — Progress Notes (Signed)
Joanna Gross 10:44 AM  Subjective: Patient doing better tolerating diet decreased pain no bowel movement in 2 days wants to go home  Objective: Vital signs stable afebrile no acute distress abdomen is soft nontender CBC okay  Assessment: Improved GI symptoms  Plan: We discussed outpatient colonoscopy and she will call me as needed otherwise follow-up in a week or 2 to set up the procedure which we discussed  [facility]  office [phone] After 5PM or if no answer call [phone]

## 2022-03-20 NOTE — Plan of Care (Signed)

## 2022-03-20 NOTE — Progress Notes (Signed)
Assessment unchanged. Pt and mother verbalized understanding of dc instructions through teach back including medications and follow up care. Discharged via wc to front entrance escorted by NT and mother.

## 2022-03-20 NOTE — Discharge Summary (Signed)
Physician Discharge Summary   Patient: Joanna Gross MRN: [id number] DOB: [birth year]  Admit date:     03/17/2022  Discharge date: 03/20/22  Discharge Physician: Estill Cotta, MD    PCP: Hayden Rasmussen, MD   Recommendations at discharge:   Started on Anusol suppositories twice daily Continue Tussionex cough syrup every 12 hours as needed, Tessalon Perles for cough Bentyl 10 mg every 8 hours as needed for abdominal spasms Patient recommended follow-up with GI in 2 weeks for outpatient colonoscopy Placed on MiraLAX daily, Colace twice daily as needed for constipation,   Discharge Diagnoses:  Acute enteritis/ileus ([place])   Hematochezia secondary to  hemorrhoids   Bronchitis   Intractable nausea and vomiting Obesity   Hospital Course:  Patient is a 56 year old female with history of obesity, who has been having some nonspecific GI and respiratory symptoms following a trip in [place].  She returned on August 23, and on the 25th she started experiencing intermittent abdominal discomfort, nausea, and her GI symptoms have progressed now to the point that she has difficulties with p.o. intake, persistent nausea and started having emesis with 1 episode yesterday.  She is also been having intermittent shortness of breath and nonspecific URI symptoms, for which she completed a course of doxycycline as an outpatient, and now her respiratory symptoms have improved.  She is also has noticed bright red blood per rectum, when she wipes but also mixed with stool and also bleeding by itself.  Due to persistent GI issues she came to the hospital.   CT scan on admission with potentially enteritis in the left upper quadrant jejunal loops which are mildly distended without associated wall thickening or substantial edema, potentially representing underlying ileus versus enteritis.   Of note, she was started on Wegovy earlier this year for weight loss, and prior to [place] trip she was tolerating that well.      Assessment and Plan:  Enteritis/ileus, acute ([place]) -History of recent travel, [place], [place] with her friends who also have nonspecific similar symptoms GI/respiratory.  COVID test negative. -CT abdomen showed enteritis/ileus -Overall improving, tolerating solid diet, has some abdominal cramps, constipation -Cleared by GI to discharge home, plan for outpatient colonoscopy -Continue soft solids, added Bentyl as needed for abdominal cramps, tramadol as needed for severe pain        Hematochezia -Possibly due to hemorrhoids.  Per patient, she had no prior history of constipation however since returning from heart travel, she had constipation followed by [facility] -Continue Anusol suppository   Acute bronchitis -Still having cough although no shortness of breath or wheezing.  CTA chest showed no PE or pneumonia -Continue Flonase, Wixela - continue Tussionex, Tessalon Perles as needed for cough     Obesity Estimated body mass index is 38.75 kg/m as calculated from the following:   Height as of this encounter: '5\' 6"'$  (1.676 m).   Weight as of this encounter: 108.9 kg.        Pain control - [employer] Controlled Substance Reporting System database was reviewed. and patient was instructed, not to drive, operate heavy machinery, perform activities at heights, swimming or participation in water activities or provide baby-sitting services while on Pain, Sleep and Anxiety Medications; until their outpatient Physician has advised to do so again. Also recommended to not to take more than prescribed Pain, Sleep and Anxiety Medications.  Consultants: GI Procedures performed: None Disposition: Home Diet recommendation:  Discharge Diet Orders (From admission, onward)     Start  Ordered   03/20/22 0000  Diet - low sodium heart healthy        03/20/22 1141            DISCHARGE MEDICATION: Allergies as of 03/20/2022       Reactions   Amoxicillin Anaphylaxis   Shortness of  breath, boils Has patient had a PCN reaction causing immediate rash, facial/tongue/throat swelling, SOB or lightheadedness with hypotension: Yes Has patient had a PCN reaction causing severe rash involving mucus membranes or skin necrosis: Yes Has patient had a PCN reaction that required hospitalization: Yes Has patient had a PCN reaction occurring within the last 10 years: No If all of the above answers are "NO", then may proceed with Cephalosporin use.   Erythromycin Shortness Of Breath   Shortness of breath, boils   Furosemide Anaphylaxis   Penicillins Anaphylaxis   Shortness of breath, boils  Has patient had a PCN reaction causing immediate rash, facial/tongue/throat swelling, SOB or lightheadedness with hypotension: Yes Has patient had a PCN reaction causing severe rash involving mucus membranes or skin necrosis: Yes Has patient had a PCN reaction that required hospitalization: Yes Has patient had a PCN reaction occurring within the last 10 years: No If all of the above answers are "NO", then may proceed with Cephalosporin use.   Bee Venom Swelling   Eucalyptus Flavor [flavoring Agent]    Hoarse voice, lip burning        Medication List     TAKE these medications    benzonatate 200 MG capsule Commonly known as: TESSALON Take 1 capsule (200 mg total) by mouth 3 (three) times daily as needed for cough. For cough What changed:  medication strength how much to take when to take this additional instructions   chlorpheniramine-HYDROcodone 10-8 MG/5ML Commonly known as: TUSSIONEX Take 5 mLs by mouth every 12 (twelve) hours as needed for cough.   cholecalciferol 25 MCG (1000 UNIT) tablet Commonly known as: VITAMIN D3 Take 1,000 Units by mouth daily.   dicyclomine 10 MG capsule Commonly known as: BENTYL Take 1 capsule (10 mg total) by mouth every 8 (eight) hours as needed for spasms. For abdominal spasms   docusate sodium 100 MG capsule Commonly known as: Colace Take 1  capsule (100 mg total) by mouth 2 (two) times daily as needed for moderate constipation or mild constipation.   EPINEPHrine 0.3 mg/0.3 mL Soaj injection Commonly known as: EPI-PEN Inject 0.3 mg into the muscle as needed for anaphylaxis.   fluticasone 50 MCG/ACT nasal spray Commonly known as: FLONASE Place 2 sprays into both nostrils daily.   hydrocortisone 25 MG suppository Commonly known as: ANUSOL-HC Place 1 suppository (25 mg total) rectally 2 (two) times daily. For hemorrhoids   ondansetron 8 MG tablet Commonly known as: ZOFRAN Take 1 tablet (8 mg total) by mouth every 8 (eight) hours as needed for nausea or vomiting. What changed: when to take this   polyethylene glycol 17 g packet Commonly known as: MIRALAX / GLYCOLAX Take 17 g by mouth daily. Also available over-the-counter.   traMADol 50 MG tablet Commonly known as: Ultram Take 1 tablet (50 mg total) by mouth every 8 (eight) hours as needed for severe pain.   Wegovy 1.7 MG/0.75ML Soaj Generic drug: Semaglutide-Weight Management Inject 1.7 mg into the skin once a week. HOLD until advised by your primary doctor to resume. What changed: additional instructions   Wixela Inhub 250-50 MCG/ACT Aepb Generic drug: fluticasone-salmeterol Inhale 1 puff into the lungs 2 (two) times  daily.        Follow-up Information     Clarene Essex, MD. Schedule an appointment as soon as possible for a visit in 2 week(s).   Specialty: Gastroenterology Why: for hospital follow-up Contact information: [address] [place] [address] [phone]         Hayden Rasmussen, MD. Schedule an appointment as soon as possible for a visit in 2 week(s).   Specialty: Family Medicine Why: for hospital follow-up Contact information: [address] [place] [address] [phone]                Discharge Exam: Danley Danker Weights   03/17/22 2205  Weight: 108.9 kg   S: Tolerating solid  diet, still has constipation but overall improving.  No fevers.  Mild abdominal cramps improving.  Hoping to go home today.  Mother at the bedside.  Vitals:   03/19/22 1929 03/19/22 2025 03/20/22 0525 03/20/22 0812  BP:  111/68 92/61   Pulse:  70 64   Resp:  16 16   Temp:  97.8 F (36.6 C) 97.6 F (36.4 C)   TempSrc:  Oral Oral   SpO2: 98% 97% 95% 97%  Weight:      Height:        Physical Exam General: Alert and oriented x 3, NAD Cardiovascular: S1 S2 clear, RRR.  Respiratory: CTAB, no wheezing, rales or rhonchi Gastrointestinal: Soft, nontender, nondistended, NBS Ext: no pedal edema bilaterally Psych: Normal affect and demeanor, alert and oriented x3    Condition at discharge: good  The results of significant diagnostics from this hospitalization (including imaging, microbiology, ancillary and laboratory) are listed below for reference.   Imaging Studies: CT Angio Chest PE W and/or Wo Contrast  Result Date: 03/18/2022 CLINICAL DATA:  Recent travel with upper respiratory symptoms. Rectal bleeding. EXAM: CT ANGIOGRAPHY CHEST CT ABDOMEN AND PELVIS WITH CONTRAST TECHNIQUE: Multidetector CT imaging of the chest was performed using the standard protocol during bolus administration of intravenous contrast. Multiplanar CT image reconstructions and MIPs were obtained to evaluate the vascular anatomy. Multidetector CT imaging of the abdomen and pelvis was performed using the standard protocol during bolus administration of intravenous contrast. RADIATION DOSE REDUCTION: This exam was performed according to the departmental dose-optimization program which includes automated exposure control, adjustment of the mA and/or kV according to patient size and/or use of iterative reconstruction technique. CONTRAST:  120m OMNIPAQUE IOHEXOL 350 MG/ML SOLN COMPARISON:  Abdomen pelvis CT 10/19/2020 FINDINGS: CTA CHEST FINDINGS Cardiovascular: The heart size is upper normal. No substantial pericardial  effusion. No thoracic aortic aneurysm. There is no filling defect within the opacified pulmonary arteries to suggest the presence of an acute pulmonary embolus. Mediastinum/Nodes: No mediastinal lymphadenopathy. There is no hilar lymphadenopathy. The esophagus has normal imaging features. There is no axillary lymphadenopathy. Lungs/Pleura: No suspicious pulmonary nodule or mass. No focal airspace consolidation. No pulmonary edema pleural effusion. Skip No worrisome lytic or sclerotic osseous abnormality. Musculoskeletal: Review of the MIP images confirms the above findings. CT ABDOMEN and PELVIS FINDINGS Hepatobiliary: No suspicious focal abnormality within the liver parenchyma. Small area of low attenuation in the anterior liver, adjacent to the falciform ligament, is in a characteristic location for focal fatty deposition. 3 mm gallstone identified towards the neck of the gallbladder. No intrahepatic or extrahepatic biliary dilation. Pancreas: No focal mass lesion. No dilatation of the main duct. No intraparenchymal cyst. No peripancreatic edema. Spleen: No splenomegaly. No focal mass lesion. Adrenals/Urinary  Tract: No adrenal nodule or mass. Kidneys unremarkable. No evidence for hydroureter. The urinary bladder appears normal for the degree of distention. Stomach/Bowel: Stomach is mildly distended with fluid. Duodenum is normally positioned as is the ligament of Treitz. Jejunal loops in the left upper quadrant are fluid-filled and mildly distended up to about 2.8 cm maximum diameter. No associated wall thickening or substantial perienteric edema/inflammation. No abrupt or discrete transition zone is evident although mid and distal small bowel is completely decompressed. The terminal ileum is normal. The appendix is not well visualized, but there is no edema or inflammation in the region of the cecum. No gross colonic mass. No colonic wall thickening. No substantial stool volume to suggest clinical constipation.  Vascular/Lymphatic: No abdominal aortic aneurysm. There is no gastrohepatic or hepatoduodenal ligament lymphadenopathy. No retroperitoneal or mesenteric lymphadenopathy. No pelvic sidewall lymphadenopathy. Reproductive: Uterus is enlarged secondary to numerous fibroids. There is no adnexal mass. Other: No intraperitoneal free fluid. Musculoskeletal: No worrisome lytic or sclerotic osseous abnormality. Review of the MIP images confirms the above findings. IMPRESSION: 1. No CT evidence for acute pulmonary embolus. 2. No acute findings in the chest. 3. Jejunal loops in the left upper quadrant are fluid-filled and mildly distended up to about 2.8 cm maximum diameter. No associated wall thickening or substantial perienteric edema/inflammation. No abrupt or discrete transition zone is evident although mid and distal small bowel is completely decompressed. Imaging features considered nonspecific but may reflect a component of underlying ileus or enteritis. 4. Cholelithiasis without evidence for cholecystitis. 5. No substantial stool volume to suggest clinical constipation. No colonic mass lesion by CT imaging. 6. Fibroid uterus. Electronically Signed   By: Misty Stanley M.D.   On: 03/18/2022 05:15   CT ABDOMEN PELVIS W CONTRAST  Result Date: 03/18/2022 CLINICAL DATA:  Recent travel with upper respiratory symptoms. Rectal bleeding. EXAM: CT ANGIOGRAPHY CHEST CT ABDOMEN AND PELVIS WITH CONTRAST TECHNIQUE: Multidetector CT imaging of the chest was performed using the standard protocol during bolus administration of intravenous contrast. Multiplanar CT image reconstructions and MIPs were obtained to evaluate the vascular anatomy. Multidetector CT imaging of the abdomen and pelvis was performed using the standard protocol during bolus administration of intravenous contrast. RADIATION DOSE REDUCTION: This exam was performed according to the departmental dose-optimization program which includes automated exposure control,  adjustment of the mA and/or kV according to patient size and/or use of iterative reconstruction technique. CONTRAST:  13m OMNIPAQUE IOHEXOL 350 MG/ML SOLN COMPARISON:  Abdomen pelvis CT 10/19/2020 FINDINGS: CTA CHEST FINDINGS Cardiovascular: The heart size is upper normal. No substantial pericardial effusion. No thoracic aortic aneurysm. There is no filling defect within the opacified pulmonary arteries to suggest the presence of an acute pulmonary embolus. Mediastinum/Nodes: No mediastinal lymphadenopathy. There is no hilar lymphadenopathy. The esophagus has normal imaging features. There is no axillary lymphadenopathy. Lungs/Pleura: No suspicious pulmonary nodule or mass. No focal airspace consolidation. No pulmonary edema pleural effusion. Skip No worrisome lytic or sclerotic osseous abnormality. Musculoskeletal: Review of the MIP images confirms the above findings. CT ABDOMEN and PELVIS FINDINGS Hepatobiliary: No suspicious focal abnormality within the liver parenchyma. Small area of low attenuation in the anterior liver, adjacent to the falciform ligament, is in a characteristic location for focal fatty deposition. 3 mm gallstone identified towards the neck of the gallbladder. No intrahepatic or extrahepatic biliary dilation. Pancreas: No focal mass lesion. No dilatation of the main duct. No intraparenchymal cyst. No peripancreatic edema. Spleen: No splenomegaly. No focal mass lesion. Adrenals/Urinary Tract: No  adrenal nodule or mass. Kidneys unremarkable. No evidence for hydroureter. The urinary bladder appears normal for the degree of distention. Stomach/Bowel: Stomach is mildly distended with fluid. Duodenum is normally positioned as is the ligament of Treitz. Jejunal loops in the left upper quadrant are fluid-filled and mildly distended up to about 2.8 cm maximum diameter. No associated wall thickening or substantial perienteric edema/inflammation. No abrupt or discrete transition zone is evident although  mid and distal small bowel is completely decompressed. The terminal ileum is normal. The appendix is not well visualized, but there is no edema or inflammation in the region of the cecum. No gross colonic mass. No colonic wall thickening. No substantial stool volume to suggest clinical constipation. Vascular/Lymphatic: No abdominal aortic aneurysm. There is no gastrohepatic or hepatoduodenal ligament lymphadenopathy. No retroperitoneal or mesenteric lymphadenopathy. No pelvic sidewall lymphadenopathy. Reproductive: Uterus is enlarged secondary to numerous fibroids. There is no adnexal mass. Other: No intraperitoneal free fluid. Musculoskeletal: No worrisome lytic or sclerotic osseous abnormality. Review of the MIP images confirms the above findings. IMPRESSION: 1. No CT evidence for acute pulmonary embolus. 2. No acute findings in the chest. 3. Jejunal loops in the left upper quadrant are fluid-filled and mildly distended up to about 2.8 cm maximum diameter. No associated wall thickening or substantial perienteric edema/inflammation. No abrupt or discrete transition zone is evident although mid and distal small bowel is completely decompressed. Imaging features considered nonspecific but may reflect a component of underlying ileus or enteritis. 4. Cholelithiasis without evidence for cholecystitis. 5. No substantial stool volume to suggest clinical constipation. No colonic mass lesion by CT imaging. 6. Fibroid uterus. Electronically Signed   By: Misty Stanley M.D.   On: 03/18/2022 05:15   DG Chest Port 1 View  Result Date: 03/18/2022 CLINICAL DATA:  Cough and shortness of breath. EXAM: PORTABLE CHEST 1 VIEW COMPARISON:  August 06, 2013 FINDINGS: The heart size and mediastinal contours are within normal limits. Both lungs are clear. The visualized skeletal structures are unremarkable. IMPRESSION: No active disease. Electronically Signed   By: Virgina Norfolk M.D.   On: 03/18/2022 02:53     Microbiology: Results for orders placed or performed during the hospital encounter of 03/17/22  SARS Coronavirus 2 by RT PCR (hospital order, performed in [facility] hospital lab) *cepheid single result test* Anterior Nasal Swab     Status: None   Collection Time: 03/18/22  4:13 AM   Specimen: Anterior Nasal Swab  Result Value Ref Range Status   SARS Coronavirus 2 by RT PCR NEGATIVE NEGATIVE Final    Comment: (NOTE) SARS-CoV-2 target nucleic acids are NOT DETECTED.  The SARS-CoV-2 RNA is generally detectable in upper and lower respiratory specimens during the acute phase of infection. The lowest concentration of SARS-CoV-2 viral copies this assay can detect is 250 copies / mL. A negative result does not preclude SARS-CoV-2 infection and should not be used as the sole basis for treatment or other patient management decisions.  A negative result may occur with improper specimen collection / handling, submission of specimen other than nasopharyngeal swab, presence of viral mutation(s) within the areas targeted by this assay, and inadequate number of viral copies (<250 copies / mL). A negative result must be combined with clinical observations, patient history, and epidemiological information.  Fact Sheet for Patients:   https://www.patel.info/  Fact Sheet for Healthcare Providers: https://hall.com/  This test is not yet approved or  cleared by the [place] FDA and has been authorized for detection and/or diagnosis  of SARS-CoV-2 by FDA under an Emergency Use Authorization (EUA).  This EUA will remain in effect (meaning this test can be used) for the duration of the COVID-19 declaration under Section 564(b)(1) of the Act, 21 U.S.C. section 360bbb-3(b)(1), unless the authorization is terminated or revoked sooner.  Performed at [facility], [place] [address] [place], [place] [address]     Labs: CBC: Recent Labs   Lab 03/17/22 2207 03/19/22 0517 03/20/22 0435  WBC 10.0 4.9 3.9*  NEUTROABS  --   --  1.9  HGB 14.9 12.2 11.9*  HCT 46.8* 37.3 37.4  MCV 96.9 95.4 97.1  PLT 158 143* 950*   Basic Metabolic Panel: Recent Labs  Lab 03/17/22 2207 03/19/22 0517  NA 136 142  K 4.5 3.6  CL 103 109  CO2 24 27  GLUCOSE 99 91  BUN 7 <5*  CREATININE 0.71 0.72  CALCIUM 10.3 9.6   Liver Function Tests: Recent Labs  Lab 03/17/22 2207  AST 19  ALT 19  ALKPHOS 72  BILITOT 0.7  PROT 8.0  ALBUMIN 4.4   CBG: No results for input(s): "GLUCAP" in the last 168 hours.  Discharge time spent: greater than 30 minutes.  Signed: Estill Cotta, MD [facility] 03/20/2022

## 2022-03-21 ENCOUNTER — Encounter: Payer: Self-pay | Admitting: Hematology

## 2022-03-26 ENCOUNTER — Emergency Department (HOSPITAL_BASED_OUTPATIENT_CLINIC_OR_DEPARTMENT_OTHER): Payer: No Typology Code available for payment source

## 2022-03-26 ENCOUNTER — Encounter (HOSPITAL_BASED_OUTPATIENT_CLINIC_OR_DEPARTMENT_OTHER): Payer: Self-pay | Admitting: Emergency Medicine

## 2022-03-26 ENCOUNTER — Emergency Department (HOSPITAL_BASED_OUTPATIENT_CLINIC_OR_DEPARTMENT_OTHER)
Admission: EM | Admit: 2022-03-26 | Discharge: 2022-03-26 | Disposition: A | Discharge status: Home / Self Care | Payer: No Typology Code available for payment source | Attending: Emergency Medicine | Admitting: Emergency Medicine

## 2022-03-26 ENCOUNTER — Other Ambulatory Visit: Payer: Self-pay

## 2022-03-26 DIAGNOSIS — R143 Flatulence: Secondary | ICD-10-CM | POA: Diagnosis not present

## 2022-03-26 DIAGNOSIS — R109 Unspecified abdominal pain: Secondary | ICD-10-CM | POA: Diagnosis present

## 2022-03-26 DIAGNOSIS — R1084 Generalized abdominal pain: Secondary | ICD-10-CM | POA: Diagnosis not present

## 2022-03-26 LAB — CBC WITH DIFFERENTIAL/PLATELET
Abs Immature Granulocytes: 0 10*3/uL (ref 0.00–0.07)
Basophils Absolute: 0 10*3/uL (ref 0.0–0.1)
Basophils Relative: 1 %
Eosinophils Absolute: 0.1 10*3/uL (ref 0.0–0.5)
Eosinophils Relative: 2 %
HCT: 41.7 % (ref 36.0–46.0)
Hemoglobin: 14 g/dL (ref 12.0–15.0)
Immature Granulocytes: 0 %
Lymphocytes Relative: 32 %
Lymphs Abs: 1 10*3/uL (ref 0.7–4.0)
MCH: 30.6 pg (ref 26.0–34.0)
MCHC: 33.6 g/dL (ref 30.0–36.0)
MCV: 91.2 fL (ref 80.0–100.0)
Monocytes Absolute: 0.3 10*3/uL (ref 0.1–1.0)
Monocytes Relative: 10 %
Neutro Abs: 1.8 10*3/uL (ref 1.7–7.7)
Neutrophils Relative %: 55 %
Platelets: 178 10*3/uL (ref 150–400)
RBC: 4.57 MIL/uL (ref 3.87–5.11)
RDW: 13.2 % (ref 11.5–15.5)
WBC: 3.3 10*3/uL — ABNORMAL LOW (ref 4.0–10.5)
nRBC: 0 % (ref 0.0–0.2)

## 2022-03-26 LAB — URINALYSIS, ROUTINE W REFLEX MICROSCOPIC
Bilirubin Urine: NEGATIVE
Glucose, UA: NEGATIVE mg/dL
Ketones, ur: NEGATIVE mg/dL
Nitrite: NEGATIVE
Protein, ur: 30 mg/dL — AB
Specific Gravity, Urine: 1.023 (ref 1.005–1.030)
WBC, UA: >50 WBC/hpf — ABNORMAL HIGH (ref 0–5)
pH: 5.5 (ref 5.0–8.0)

## 2022-03-26 LAB — COMPREHENSIVE METABOLIC PANEL
ALT: 19 U/L (ref 0–44)
AST: 18 U/L (ref 15–41)
Albumin: 4.6 g/dL (ref 3.5–5.0)
Alkaline Phosphatase: 59 U/L (ref 38–126)
Anion gap: 7 (ref 5–15)
BUN: 6 mg/dL (ref 6–20)
CO2: 29 mmol/L (ref 22–32)
Calcium: 10.5 mg/dL — ABNORMAL HIGH (ref 8.9–10.3)
Chloride: 101 mmol/L (ref 98–111)
Creatinine, Ser: 0.93 mg/dL (ref 0.44–1.00)
GFR, Estimated: >60 mL/min (ref >60)
Glucose, Bld: 77 mg/dL (ref 70–99)
Potassium: 4.2 mmol/L (ref 3.5–5.1)
Sodium: 137 mmol/L (ref 135–145)
Total Bilirubin: 0.5 mg/dL (ref 0.3–1.2)
Total Protein: 8.2 g/dL — ABNORMAL HIGH (ref 6.5–8.1)

## 2022-03-26 LAB — LIPASE, BLOOD: Lipase: 19 U/L (ref 11–51)

## 2022-03-26 MED ORDER — IOHEXOL 300 MG/ML  SOLN
100.0000 mL | Freq: Once | INTRAMUSCULAR | Status: AC | PRN
  Administered 2022-03-26: 100 mL via INTRAVENOUS

## 2022-03-26 NOTE — ED Triage Notes (Signed)
Pt arrived POV. Pt caox4 and ambulatory. Pt reports she has not had a BM since Saturday. Pt was recently hospitalized for acute enteritis/ileus. Pt reports she has been compliant with meds, but still a week without a BM. Pt denies abd pain but states she feels full and that her stomach is more distended than normal.

## 2022-03-26 NOTE — ED Notes (Signed)
Dc instructions reviewed with patient. Patient voiced understanding. Dc with belongings.  °

## 2022-03-26 NOTE — Discharge Instructions (Addendum)
Titrate MiraLAX to effect as discussed.

## 2022-03-26 NOTE — ED Provider Notes (Signed)
[facility] Provider Note   CSN: [id number] Arrival date & time: 03/26/22  1008     History  Chief Complaint  Patient presents with   Abdominal Pain    Joanna Gross is a 56 y.o. female.  Patient here with abdominal pain.  Recently hospitalized for enteritis, ileus.  She denies any nausea or vomiting.  She is passing gas.  But she has not had a bowel movement in a week.  She is feeling distended and uncomfortable in full.  Denies any weakness or numbness or chills.  No fever.  She has been using 1 scoop of MiraLAX a day with no improvement.  Denies any chest pain, shortness of breath  The history is provided by the patient.       Home Medications Prior to Admission medications   Medication Sig Start Date End Date Taking? Authorizing Provider  benzonatate (TESSALON) 200 MG capsule Take 1 capsule (200 mg total) by mouth 3 (three) times daily as needed for cough. For cough 03/20/22   Rai, Ripudeep K, MD  chlorpheniramine-HYDROcodone (TUSSIONEX) 10-8 MG/5ML Take 5 mLs by mouth every 12 (twelve) hours as needed for cough. 03/20/22   Rai, Vernelle Emerald, MD  cholecalciferol (VITAMIN D3) 25 MCG (1000 UNIT) tablet Take 1,000 Units by mouth daily.    [provider]  dicyclomine (BENTYL) 10 MG capsule Take 1 capsule (10 mg total) by mouth every 8 (eight) hours as needed for spasms. For abdominal spasms 03/20/22   Rai, Vernelle Emerald, MD  docusate sodium (COLACE) 100 MG capsule Take 1 capsule (100 mg total) by mouth 2 (two) times daily as needed for moderate constipation or mild constipation. 03/20/22 06/18/22  Rai, Ripudeep K, MD  EPINEPHrine 0.3 mg/0.3 mL IJ SOAJ injection Inject 0.3 mg into the muscle as needed for anaphylaxis. 02/10/22   [provider]  fluticasone (FLONASE) 50 MCG/ACT nasal spray Place 2 sprays into both nostrils daily. 11/13/21   [provider]  hydrocortisone (ANUSOL-HC) 25 MG suppository Place 1 suppository (25 mg total)  rectally 2 (two) times daily. For hemorrhoids 03/20/22   Rai, Vernelle Emerald, MD  ondansetron (ZOFRAN) 8 MG tablet Take 1 tablet (8 mg total) by mouth every 8 (eight) hours as needed for nausea or vomiting. 03/20/22   Rai, Ripudeep K, MD  polyethylene glycol (MIRALAX / GLYCOLAX) 17 g packet Take 17 g by mouth daily. Also available over-the-counter. 03/20/22   Rai, Vernelle Emerald, MD  traMADol (ULTRAM) 50 MG tablet Take 1 tablet (50 mg total) by mouth every 8 (eight) hours as needed for severe pain. 03/20/22 03/20/23  Rai, Ripudeep K, MD  WEGOVY 1.7 MG/0.75ML SOAJ Inject 1.7 mg into the skin once a week. HOLD until advised by your primary doctor to resume. 03/20/22   Rai, Ripudeep K, MD  WIXELA INHUB 250-50 MCG/ACT AEPB Inhale 1 puff into the lungs 2 (two) times daily. 03/14/22   [provider]      Allergies    Amoxicillin, Erythromycin, Furosemide, Penicillins, Bee venom, and Eucalyptus flavor [flavoring agent]    Review of Systems   Review of Systems  Physical Exam Updated Vital Signs BP 106/62 (BP Location: Left Wrist)   Pulse 65   Temp 98 F (36.7 C) (Oral)   Resp 20   Ht '5\' 6"'$  (1.676 m)   Wt 103.4 kg   LMP 02/19/2022 (Exact Date)   SpO2 97%   BMI 36.80 kg/m  Physical Exam Vitals and nursing note reviewed.  Constitutional:      General: She is not in acute distress.    Appearance: She is well-developed. She is not ill-appearing.  HENT:     Head: Normocephalic and atraumatic.  Eyes:     Extraocular Movements: Extraocular movements intact.     Conjunctiva/sclera: Conjunctivae normal.     Pupils: Pupils are equal, round, and reactive to light.  Cardiovascular:     Rate and Rhythm: Normal rate and regular rhythm.     Pulses: Normal pulses.     Heart sounds: Normal heart sounds. No murmur heard. Pulmonary:     Effort: Pulmonary effort is normal. No respiratory distress.     Breath sounds: Normal breath sounds.  Abdominal:     Palpations: Abdomen is soft.     Tenderness: There  is abdominal tenderness.  Musculoskeletal:        General: No swelling.     Cervical back: Normal range of motion and neck supple.  Skin:    General: Skin is warm and dry.     Capillary Refill: Capillary refill takes less than 2 seconds.  Neurological:     General: No focal deficit present.     Mental Status: She is alert.  Psychiatric:        Mood and Affect: Mood normal.     ED Results / Procedures / Treatments   Labs (all labs ordered are listed, but only abnormal results are displayed) Labs Reviewed  CBC WITH DIFFERENTIAL/PLATELET - Abnormal; Notable for the following components:      Result Value   WBC 3.3 (*)    All other components within normal limits  COMPREHENSIVE METABOLIC PANEL - Abnormal; Notable for the following components:   Calcium 10.5 (*)    Total Protein 8.2 (*)    All other components within normal limits  URINALYSIS, ROUTINE W REFLEX MICROSCOPIC - Abnormal; Notable for the following components:   APPearance HAZY (*)    Hgb urine dipstick SMALL (*)    Protein, ur 30 (*)    Leukocytes,Ua LARGE (*)    WBC, UA >50 (*)    Bacteria, UA RARE (*)    Non Squamous Epithelial 0-5 (*)    All other components within normal limits  LIPASE, BLOOD    EKG None  Radiology CT ABDOMEN PELVIS W CONTRAST  Result Date: 03/26/2022 CLINICAL DATA:  Abdominal pain EXAM: CT ABDOMEN AND PELVIS WITH CONTRAST TECHNIQUE: Multidetector CT imaging of the abdomen and pelvis was performed using the standard protocol following bolus administration of intravenous contrast. RADIATION DOSE REDUCTION: This exam was performed according to the departmental dose-optimization program which includes automated exposure control, adjustment of the mA and/or kV according to patient size and/or use of iterative reconstruction technique. CONTRAST:  192m OMNIPAQUE IOHEXOL 300 MG/ML  SOLN COMPARISON:  03/18/2022 FINDINGS: Lower chest: Visualized lower lung fields are clear. Hepatobiliary: Ill-defined  low-density adjacent to falciform ligament may suggest normal variation due to aberrant venous drainage. There is no dilation of bile ducts. Gallbladder stones are seen. There are no imaging signs of acute cholecystitis. Pancreas: No focal abnormalities are seen. Spleen: Unremarkable. Adrenals/Urinary Tract: Adrenals are unremarkable. There is no hydronephrosis. There are no renal or ureteral stones. Urinary bladder is not distended. Stomach/Bowel: Stomach is unremarkable. Small bowel loops are not dilated. Dilation of jejunum seen in the previous study is not evident in the current study. Cecum is higher than usual in position. Appendix is difficult to visualize. There is no pericecal inflammation. There is no  significant wall thickening in colon. There is no pericolic stranding. Moderate to large amount of stool is seen in colon. Vascular/Lymphatic: Unremarkable. Reproductive: Uterus is enlarged with multiple fibroids. There is fluid density in some of the fibroids suggesting cystic degeneration. Other: There is no ascites or pneumoperitoneum. Umbilical hernia containing fat is seen. Musculoskeletal: Degenerative changes are noted at the L4-L5 and L5-S1 levels with encroachment of neural foramina, more so at L5-S1 level. IMPRESSION: There is no evidence of intestinal obstruction or pneumoperitoneum. There is no hydronephrosis. Gallbladder stones.  There are no signs of acute cholecystitis. Enlarged uterus with multiple fibroids.  Lumbar spondylosis. Other findings as described in the body of the report. Electronically Signed   By: Elmer Picker M.D.   On: 03/26/2022 13:02    Procedures Procedures    Medications Ordered in ED Medications  iohexol (OMNIPAQUE) 300 MG/ML solution 100 mL (100 mLs Intravenous Contrast Given 03/26/22 1223)    ED Course/ Medical Decision Making/ A&P                           Medical Decision Making Amount and/or Complexity of Data Reviewed Labs: ordered. Radiology:  ordered.  Risk Prescription drug management.   Joanna Gross is here with abdominal distention.  Normal vitals.  No fever.Recent enteritis/ileus at hospital stay.  Mostly concern for constipation.  Differential diagnosis is bowel obstruction versus ileus versus constipation.  We will get a CT scan, CBC, CMP, lipase.  Will give IV fluids and reevaluate.  Per my review and interpretation of labs is no significant anemia, electrolyte abnormality, kidney injury.  Urinalysis negative for infection.  CT scan per radiology report shows no acute findings.  We will have her increase MiraLAX.  Discharged in good condition.  Understands return precautions.  This chart was dictated using voice recognition software.  Despite best efforts to proofread,  errors can occur which can change the documentation meaning.         Final Clinical Impression(s) / ED Diagnoses Final diagnoses:  Generalized abdominal pain    Rx / DC Orders ED Discharge Orders     None         Lennice Sites, DO 03/26/22 1310
